# Patient Record
Sex: Male | Born: 1970 | Race: White | Hispanic: No | Marital: Married | Smoking: Never smoker
Health system: Southern US, Community
[De-identification: ages and names within clinical notes are randomized; demographics above are authoritative.]

## PROBLEM LIST (undated history)

## (undated) DIAGNOSIS — F32A Depression, unspecified: Secondary | ICD-10-CM

## (undated) DIAGNOSIS — I219 Acute myocardial infarction, unspecified: Secondary | ICD-10-CM

## (undated) DIAGNOSIS — F319 Bipolar disorder, unspecified: Secondary | ICD-10-CM

## (undated) DIAGNOSIS — I10 Essential (primary) hypertension: Secondary | ICD-10-CM

## (undated) DIAGNOSIS — E7800 Pure hypercholesterolemia, unspecified: Secondary | ICD-10-CM

## (undated) DIAGNOSIS — T300 Burn of unspecified body region, unspecified degree: Secondary | ICD-10-CM

## (undated) DIAGNOSIS — M1990 Unspecified osteoarthritis, unspecified site: Secondary | ICD-10-CM

## (undated) DIAGNOSIS — F329 Major depressive disorder, single episode, unspecified: Secondary | ICD-10-CM

## (undated) DIAGNOSIS — E119 Type 2 diabetes mellitus without complications: Secondary | ICD-10-CM

## (undated) DIAGNOSIS — R911 Solitary pulmonary nodule: Secondary | ICD-10-CM

## (undated) DIAGNOSIS — K5900 Constipation, unspecified: Secondary | ICD-10-CM

## (undated) DIAGNOSIS — F40240 Claustrophobia: Secondary | ICD-10-CM

## (undated) DIAGNOSIS — Z8489 Family history of other specified conditions: Secondary | ICD-10-CM

## (undated) DIAGNOSIS — F419 Anxiety disorder, unspecified: Secondary | ICD-10-CM

## (undated) DIAGNOSIS — G4730 Sleep apnea, unspecified: Secondary | ICD-10-CM

## (undated) DIAGNOSIS — I2510 Atherosclerotic heart disease of native coronary artery without angina pectoris: Secondary | ICD-10-CM

## (undated) DIAGNOSIS — T8859XA Other complications of anesthesia, initial encounter: Secondary | ICD-10-CM

## (undated) DIAGNOSIS — T7840XA Allergy, unspecified, initial encounter: Secondary | ICD-10-CM

## (undated) DIAGNOSIS — T4145XA Adverse effect of unspecified anesthetic, initial encounter: Secondary | ICD-10-CM

## (undated) DIAGNOSIS — T754XXA Electrocution, initial encounter: Secondary | ICD-10-CM

## (undated) DIAGNOSIS — G459 Transient cerebral ischemic attack, unspecified: Secondary | ICD-10-CM

## (undated) DIAGNOSIS — K219 Gastro-esophageal reflux disease without esophagitis: Secondary | ICD-10-CM

## (undated) DIAGNOSIS — G43909 Migraine, unspecified, not intractable, without status migrainosus: Secondary | ICD-10-CM

## (undated) DIAGNOSIS — I209 Angina pectoris, unspecified: Secondary | ICD-10-CM

## (undated) DIAGNOSIS — I639 Cerebral infarction, unspecified: Secondary | ICD-10-CM

## (undated) HISTORY — PX: NASAL SINUS SURGERY: SHX719

## (undated) HISTORY — DX: Type 2 diabetes mellitus without complications: E11.9

## (undated) HISTORY — PX: JOINT REPLACEMENT: SHX530

## (undated) HISTORY — DX: Acute myocardial infarction, unspecified: I21.9

## (undated) HISTORY — PX: LAPAROSCOPIC CHOLECYSTECTOMY: SUR755

## (undated) HISTORY — DX: Essential (primary) hypertension: I10

## (undated) HISTORY — DX: Allergy, unspecified, initial encounter: T78.40XA

## (undated) HISTORY — DX: Constipation, unspecified: K59.00

## (undated) HISTORY — PX: OTHER SURGICAL HISTORY: SHX169

## (undated) HISTORY — PX: CARDIAC CATHETERIZATION: SHX172

## (undated) HISTORY — PX: NOSE SURGERY: SHX723

---
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
## 2002-12-23 DIAGNOSIS — I639 Cerebral infarction, unspecified: Secondary | ICD-10-CM | POA: Insufficient documentation

## 2002-12-23 HISTORY — DX: Cerebral infarction, unspecified: I63.9

## 2003-12-24 DIAGNOSIS — I2510 Atherosclerotic heart disease of native coronary artery without angina pectoris: Secondary | ICD-10-CM | POA: Insufficient documentation

## 2003-12-24 HISTORY — DX: Atherosclerotic heart disease of native coronary artery without angina pectoris: I25.10

## 2007-11-25 ENCOUNTER — Ambulatory Visit (HOSPITAL_COMMUNITY): Admission: RE | Admit: 2007-11-25 | Discharge: 2007-11-25 | Payer: Self-pay | Admitting: Orthopedic Surgery

## 2009-12-23 HISTORY — PX: CORONARY ANGIOPLASTY WITH STENT PLACEMENT: SHX49

## 2010-12-23 DIAGNOSIS — I219 Acute myocardial infarction, unspecified: Secondary | ICD-10-CM

## 2010-12-23 HISTORY — DX: Acute myocardial infarction, unspecified: I21.9

## 2010-12-23 HISTORY — PX: SHOULDER OPEN ROTATOR CUFF REPAIR: SHX2407

## 2014-06-02 ENCOUNTER — Encounter: Payer: Self-pay | Admitting: Internal Medicine

## 2014-06-02 ENCOUNTER — Ambulatory Visit (INDEPENDENT_AMBULATORY_CARE_PROVIDER_SITE_OTHER): Payer: BC Managed Care – PPO | Admitting: Internal Medicine

## 2014-06-02 VITALS — BP 112/70 | HR 66 | Ht 68.0 in | Wt 244.4 lb

## 2014-06-02 DIAGNOSIS — R0600 Dyspnea, unspecified: Secondary | ICD-10-CM

## 2014-06-02 DIAGNOSIS — I2510 Atherosclerotic heart disease of native coronary artery without angina pectoris: Secondary | ICD-10-CM

## 2014-06-02 DIAGNOSIS — R0683 Snoring: Secondary | ICD-10-CM

## 2014-06-02 DIAGNOSIS — R079 Chest pain, unspecified: Secondary | ICD-10-CM

## 2014-06-02 DIAGNOSIS — I252 Old myocardial infarction: Secondary | ICD-10-CM

## 2014-06-02 DIAGNOSIS — I10 Essential (primary) hypertension: Secondary | ICD-10-CM

## 2014-06-02 DIAGNOSIS — R0602 Shortness of breath: Secondary | ICD-10-CM

## 2014-06-02 DIAGNOSIS — R0609 Other forms of dyspnea: Secondary | ICD-10-CM | POA: Insufficient documentation

## 2014-06-02 DIAGNOSIS — R5383 Other fatigue: Secondary | ICD-10-CM

## 2014-06-02 DIAGNOSIS — R0989 Other specified symptoms and signs involving the circulatory and respiratory systems: Secondary | ICD-10-CM

## 2014-06-02 DIAGNOSIS — R5381 Other malaise: Secondary | ICD-10-CM

## 2014-06-02 HISTORY — DX: Essential (primary) hypertension: I10

## 2014-06-02 HISTORY — DX: Other fatigue: R53.83

## 2014-06-02 HISTORY — DX: Other forms of dyspnea: R06.09

## 2014-06-02 HISTORY — DX: Dyspnea, unspecified: R06.00

## 2014-06-02 HISTORY — DX: Snoring: R06.83

## 2014-06-02 HISTORY — DX: Old myocardial infarction: I25.2

## 2014-06-02 HISTORY — DX: Atherosclerotic heart disease of native coronary artery without angina pectoris: I25.10

## 2014-06-02 NOTE — Patient Instructions (Signed)
Your physician recommends that you schedule a follow-up appointment in: After Test  Your physician has recommended that you have a cardiopulmonary stress test (CPX). CPX testing is a non-invasive measurement of heart and lung function. It replaces a traditional treadmill stress test. This type of test provides a tremendous amount of information that relates not only to your present condition but also for future outcomes. This test combines measurements of you ventilation, respiratory gas exchange in the lungs, electrocardiogram (EKG), blood pressure and physical response before, during, and following an exercise protocol.

## 2014-06-02 NOTE — Progress Notes (Signed)
OFFICE NOTE  Chief Complaint:  Establish new cardiologist  Primary Care Physician: Riley Riches, NP  HPI:  Riley Grant is a 43 year old male (who self-reports that he has a fifth grade education and cannot read or write), who is here to establish cardiovascular care. He was previously seen by cardiologist and in 2012 underwent heart catheterization after suffering 2 transient ischemic attacks. No etiology was found. He then apparently had stress testing which was not remarkable, but persisted in having chest pain and had a heart catheterization which shows a 70% proximal LAD stenosis. I reviewed a print out that was provided from the heart catheterization as well as a post PCI photo demonstrating a stent in the proximal LAD. It appears he had a very good result. He said he felt markedly better for about 5 days after the procedure and then started going downhill after that. He reports progressive shortness of breath, fatigue, poor exercise tolerance, feeling of bloating and early satiety, but normal bowel movements. He has undergone several tests as well as blood work which have only revealed a low testosterone. It is not clear whether this is low total testosterone or whether his free testosterone was checked, but it would be worthwhile doing this. His family history is significant for father who had an MI in her grandfather who had died of sudden cardiac death at age 33. He also reports leg pain, cold feet, lightheadedness and headaches.  He reports poor sleep at night and his wife notes that he snores and occasionally stops breathing which is concerning for a short sleep apnea.  PMHx:  Past Medical History  Diagnosis Date  . History of TIAs   . Heart attack     History reviewed. No pertinent past surgical history.  FAMHx:  Family History  Problem Relation Age of Onset  . Hypertension Father   . Stroke Father   . Hypertension Maternal Grandmother   . Cancer Maternal Grandmother     . Hypertension Paternal Grandmother   . Alzheimer's disease Paternal Grandmother     SOCHx:   reports that he has never smoked. He has never used smokeless tobacco. He reports that he does not drink alcohol or use illicit drugs.  ALLERGIES:  No Known Allergies  ROS: A comprehensive review of systems was negative except for: Constitutional: positive for fatigue Respiratory: positive for dyspnea on exertion Cardiovascular: positive for chest pain Behavioral/Psych: positive for anxiety  HOME MEDS: Current Outpatient Prescriptions  Medication Sig Dispense Refill  . aspirin EC 81 MG tablet Take 81 mg by mouth daily.      . clopidogrel (PLAVIX) 75 MG tablet Take 1 tablet by mouth daily.      . fluorometholone (FML) 0.1 % ophthalmic suspension Place 1 drop into both eyes 3 (three) times daily.      . isosorbide mononitrate (IMDUR) 30 MG 24 hr tablet Take 1 tablet by mouth daily.      Marland Kitchen lisinopril (PRINIVIL,ZESTRIL) 20 MG tablet Take 1 tablet by mouth daily.      Marland Kitchen loratadine (CLARITIN) 10 MG tablet Take 10 mg by mouth daily.      . metoprolol succinate (TOPROL-XL) 25 MG 24 hr tablet Take 1 tablet by mouth daily.      . nitroGLYCERIN (NITROSTAT) 0.3 MG SL tablet Place 0.3 mg under the tongue every 5 (five) minutes as needed for chest pain.      Marland Kitchen OVER THE COUNTER MEDICATION Renafood - take 1 tablet by mouth once  daily.      Marland Kitchen OVER THE COUNTER MEDICATION Diet & Cleanse - take 1 tablet by mouth once daily.      . ranitidine (ZANTAC) 150 MG tablet Take 150 mg by mouth daily.       No current facility-administered medications for this visit.    LABS/IMAGING: No results found for this or any previous visit (from the past 48 hour(s)). No results found.  VITALS: BP 112/70  Pulse 66  Ht 5\' 8"  (1.727 m)  Wt 244 lb 6.4 oz (110.859 kg)  BMI 37.17 kg/m2  EXAM: General appearance: alert and no distress Neck: no carotid bruit and no JVD Lungs: clear to auscultation bilaterally Heart:  regular rate and rhythm, S1, S2 normal, no murmur, click, rub or gallop Abdomen: soft, non-tender; bowel sounds normal; no masses,  no organomegaly Extremities: extremities normal, atraumatic, no cyanosis or edema Pulses: 2+ and symmetric Skin: Skin color, texture, turgor normal. No rashes or lesions Neurologic: Alert and oriented X 3, normal strength and tone. Normal symmetric reflexes. Normal coordination and gait Psych: Mildly anxious  EKG: Normal sinus rhythm at 66, no ischemic changes  ASSESSMENT: 1. Fatigue, chest pain, shortness of breath 2. Coronary artery disease status post PCI to the proximal LAD 3. Hypertension 4. Snoring and nonrestorative sleep  PLAN: 1.   Riley Grant has a number of complaints today including fatigue, chest pain, shortness of breath and markedly decreased exercise tolerance. He reports he can barely do any activities without getting shortness of breath. His symptoms of it progressively getting worse since about 5 days after his stent in 2012. Despite this, he has not had any other cardiac events that we are aware of. Most of his symptoms occur with exertion and are associated with shortness of breath. His chest pain is not as significant as it was when he had a stent. I would recommend an exercise Cardiolite metabolic stress test to evaluate both for pulmonary and cardiopulmonary etiologies of his shortness of breath and decreased exercise tolerance. If this is markedly abnormal he may need cardiac catheterization. We may want to also consider echocardiography to evaluate for any cardiomyopathy, although his physical exam does not support any heart failure. His fatigue, poor sleep and lack of energy may be due to sleep apnea. I would recommend a sleep study and will arrange for that. I'm happy to refill his cardiac medications today and he should continue on those. He questioned about whether it was safe to take supplemental testosterone with coronary disease. Data on  this suggest that it is probably okay without any significant increased risk as long as the target of treatment is to the normal range of testosterone. He should have a free testosterone performed in addition to total testosterone, preferably between 7 and 8 AM in a fasting state. This may give the most accurate result.  It is not clear that the low testosterone alone is causing him such significant symptoms. He did have some residual coronary disease distal in the LAD which could have certainly become worse although would not explain why his symptoms have been persistent for over 3 years.    Plan to see him back to discuss results of his cardiopulmonary exercise test as well as his sleep study. I will try to obtain additional records from his cardiologist in [place].  Thank you very much for the consultation.  Pixie Casino, MD, [facility] Attending Cardiologist [facility]  Lakara Weiland C 06/02/2014, 5:30 PM

## 2014-06-07 ENCOUNTER — Ambulatory Visit (HOSPITAL_COMMUNITY): Payer: BC Managed Care – PPO | Attending: Internal Medicine

## 2014-06-07 DIAGNOSIS — R0602 Shortness of breath: Secondary | ICD-10-CM

## 2014-06-07 DIAGNOSIS — R079 Chest pain, unspecified: Secondary | ICD-10-CM

## 2014-07-14 ENCOUNTER — Other Ambulatory Visit: Payer: Self-pay | Admitting: *Deleted

## 2014-07-14 DIAGNOSIS — R5383 Other fatigue: Secondary | ICD-10-CM

## 2014-07-14 DIAGNOSIS — I2510 Atherosclerotic heart disease of native coronary artery without angina pectoris: Secondary | ICD-10-CM

## 2014-07-14 DIAGNOSIS — R0683 Snoring: Secondary | ICD-10-CM

## 2014-08-21 ENCOUNTER — Ambulatory Visit (HOSPITAL_BASED_OUTPATIENT_CLINIC_OR_DEPARTMENT_OTHER): Payer: BC Managed Care – PPO | Attending: Internal Medicine | Admitting: Radiology

## 2014-08-21 VITALS — Ht 70.0 in | Wt 225.0 lb

## 2014-08-21 DIAGNOSIS — I2510 Atherosclerotic heart disease of native coronary artery without angina pectoris: Secondary | ICD-10-CM

## 2014-08-21 DIAGNOSIS — R5383 Other fatigue: Secondary | ICD-10-CM

## 2014-08-21 DIAGNOSIS — G4733 Obstructive sleep apnea (adult) (pediatric): Secondary | ICD-10-CM | POA: Insufficient documentation

## 2014-08-21 DIAGNOSIS — R0683 Snoring: Secondary | ICD-10-CM

## 2014-08-26 DIAGNOSIS — G4710 Hypersomnia, unspecified: Secondary | ICD-10-CM

## 2014-08-26 DIAGNOSIS — G4730 Sleep apnea, unspecified: Secondary | ICD-10-CM

## 2014-08-26 NOTE — Sleep Study (Signed)
   NAME: Riley Grant DATE OF BIRTH:  1971-07-28 MEDICAL RECORD NUMBER [id number]  LOCATION: [facility]  PHYSICIAN: Kathee Delton  DATE OF STUDY: 08/21/2014  SLEEP STUDY TYPE: Nocturnal Polysomnogram               REFERRING PHYSICIAN: Pixie Casino., MD  INDICATION FOR STUDY: Hypersomnia with sleep apnea  EPWORTH SLEEPINESS SCORE:  24 HEIGHT: 5\' 10"  (177.8 cm)  WEIGHT: 225 lb (102.059 kg)    Body mass index is 32.28 kg/(m^2).  NECK SIZE: 17 in.  MEDICATIONS: Reviewed in the sleep record  SLEEP ARCHITECTURE: The patient had a total sleep time of 308 minutes with very little slow-wave sleep or REM. Sleep onset latency was prolonged at 61 minutes, and REM onset was delayed as well. Sleep efficiency was poor at 56% during the diagnostic portion of the study, but increased to 81% during the titration portion.  RESPIRATORY DATA: The patient underwent a split night protocol where he was found to have 41 obstructive events in the first 126 minutes of sleep. This gave him an AHI of 20 events per hour during the diagnostic portion of the study. The events occurred in all body positions, and there was loud snoring noted throughout. By protocol, he was fitted with a medium Fischer Paykel Simplus full face mask, and found to have an optimal CPAP pressure of 9 cm of water.  OXYGEN DATA: There was oxygen desaturation as low as 80% with the patient's obstructive events  CARDIAC DATA: Rare PAC noted  MOVEMENT/PARASOMNIA: The patient had no significant periodic limb movements or other abnormal behavior.  IMPRESSION/ RECOMMENDATION:    1) split-night study reveals moderate obstructive sleep apnea, with an AHI of 20 events per hour and oxygen desaturation as low as 80% during the diagnostic portion of the study. The patient was then fitted with a medium Fischer Paykel Simplus full face mask, and found to have an optimal CPAP pressure of 9 cm of water. He should also be  encouraged to work aggressively on weight loss.  2) rare PAC noted, but no clinically significant arrhythmias were seen.     Kathee Delton Diplomate, American Board of Sleep Medicine  ELECTRONICALLY SIGNED ON:  08/26/2014, 6:07 PM [place] PH: [phone]   FX: [phone] [place]

## 2014-09-01 ENCOUNTER — Telehealth: Payer: Self-pay | Admitting: Internal Medicine

## 2014-09-01 NOTE — Telephone Encounter (Signed)
Pt had a sleep study on 08-20-14,still have not heard from it.

## 2014-09-01 NOTE — Telephone Encounter (Signed)
Deferred to Dr. Debara Pickett. See sleep study report read by Dr. Gwenette Greet.

## 2014-09-06 ENCOUNTER — Telehealth: Payer: Self-pay | Admitting: *Deleted

## 2014-09-06 NOTE — Telephone Encounter (Signed)
Called patient to notify him of his sleep study results. He requested that I call and notify his wife of the results.

## 2014-09-06 NOTE — Telephone Encounter (Signed)
Moderate OSA - CPAP At 9 cmH20 - he needs referral to DME supply company.   Dr. Lemmie Evens

## 2014-09-06 NOTE — Telephone Encounter (Signed)
Spoke with wife and informed her of patient's sleep results and follow plan.

## 2014-09-06 NOTE — Telephone Encounter (Signed)
Patient and wife notified of sleep study results. Will do referral to [facility] for CPAP equipment and set up.

## 2014-09-07 ENCOUNTER — Telehealth: Payer: Self-pay | Admitting: *Deleted

## 2014-09-07 NOTE — Telephone Encounter (Signed)
Faxed referral  Order,sleep study, supporting office notes, demographics and insurance information to [facility].

## 2014-09-19 ENCOUNTER — Telehealth: Payer: Self-pay | Admitting: Internal Medicine

## 2014-09-19 NOTE — Telephone Encounter (Signed)
Pt still have not received his sleep machine,she need you to check on this please.

## 2014-09-19 NOTE — Telephone Encounter (Signed)
Called [facility] and they are going to call pt. And let them know whats going on

## 2014-09-21 ENCOUNTER — Telehealth: Payer: Self-pay | Admitting: Internal Medicine

## 2014-09-21 NOTE — Telephone Encounter (Signed)
RN called and spoke to Riley Grant (of  [facility]) she states she received authorization from [employer]. She will be contacting the patient today. RN notified wife. She is aware.

## 2014-09-21 NOTE — Telephone Encounter (Signed)
Pt still have not gotten his sleep machine and he have not heard from [facility].

## 2014-11-10 ENCOUNTER — Ambulatory Visit: Payer: BC Managed Care – PPO | Admitting: Cardiovascular Disease

## 2015-01-04 ENCOUNTER — Telehealth: Payer: Self-pay | Admitting: Internal Medicine

## 2015-01-05 ENCOUNTER — Ambulatory Visit: Payer: Self-pay | Admitting: Internal Medicine

## 2015-01-05 NOTE — Telephone Encounter (Signed)
Close encounter 

## 2015-01-17 ENCOUNTER — Ambulatory Visit: Payer: Self-pay | Admitting: Internal Medicine

## 2015-01-24 ENCOUNTER — Encounter: Payer: Self-pay | Admitting: Internal Medicine

## 2015-02-15 ENCOUNTER — Telehealth: Payer: Self-pay | Admitting: Internal Medicine

## 2015-02-15 NOTE — Telephone Encounter (Signed)
Returned call to wife. She states she wants to schedule an appointment for her husband as he was told by his PCP after some lab work that his labs showed he was at an increased risk of heart attack and stroke and that his body was "absorbing" all the medication and his plavix wasn't getting to his body appropriately. Informed wife of Dr. Lysbeth Penner first available appointment 3/11 and that he can canceled and was a no show for 2 visits in January. Appointment made for 3/11 at 2:45 and informed wife that I will attempt to obtain a copy of these labs for Dr. Debara Pickett to review prior to his appointment. She agreed with plan and voiced understanding.

## 2015-02-15 NOTE — Telephone Encounter (Signed)
Labs requested from PCP. 

## 2015-02-15 NOTE — Telephone Encounter (Signed)
Pt's wife called in stating that his PCP recommended that he follow up with his cardiologist as soon as possible because he is at high risk for a stroke and heart attack. I informed her that Dr. Lysbeth Penner next available appt ws 3/11 and an extender 3/10. Please call  Thanks

## 2015-02-15 NOTE — Telephone Encounter (Signed)
Labs received and placed in Dr. Lysbeth Penner mailbox for review when he is back in office

## 2015-03-03 ENCOUNTER — Ambulatory Visit (INDEPENDENT_AMBULATORY_CARE_PROVIDER_SITE_OTHER): Payer: 59 | Admitting: Internal Medicine

## 2015-03-03 ENCOUNTER — Encounter: Payer: Self-pay | Admitting: Internal Medicine

## 2015-03-03 ENCOUNTER — Ambulatory Visit
Admission: RE | Admit: 2015-03-03 | Discharge: 2015-03-03 | Disposition: A | Discharge status: Home / Self Care | Payer: 59 | Source: Ambulatory Visit | Attending: Internal Medicine | Admitting: Internal Medicine

## 2015-03-03 ENCOUNTER — Other Ambulatory Visit: Payer: Self-pay | Admitting: *Deleted

## 2015-03-03 VITALS — BP 108/62 | HR 82 | Ht 68.0 in | Wt 256.1 lb

## 2015-03-03 DIAGNOSIS — I2510 Atherosclerotic heart disease of native coronary artery without angina pectoris: Secondary | ICD-10-CM

## 2015-03-03 DIAGNOSIS — I208 Other forms of angina pectoris: Secondary | ICD-10-CM

## 2015-03-03 DIAGNOSIS — R079 Chest pain, unspecified: Secondary | ICD-10-CM

## 2015-03-03 DIAGNOSIS — Z01818 Encounter for other preprocedural examination: Secondary | ICD-10-CM

## 2015-03-03 DIAGNOSIS — D689 Coagulation defect, unspecified: Secondary | ICD-10-CM

## 2015-03-03 DIAGNOSIS — I10 Essential (primary) hypertension: Secondary | ICD-10-CM

## 2015-03-03 DIAGNOSIS — I209 Angina pectoris, unspecified: Secondary | ICD-10-CM

## 2015-03-03 DIAGNOSIS — R5383 Other fatigue: Secondary | ICD-10-CM

## 2015-03-03 DIAGNOSIS — I252 Old myocardial infarction: Secondary | ICD-10-CM

## 2015-03-03 DIAGNOSIS — I25119 Atherosclerotic heart disease of native coronary artery with unspecified angina pectoris: Secondary | ICD-10-CM

## 2015-03-03 NOTE — Patient Instructions (Signed)
Your physician has requested that you have a cardiac catheterization (Right Radial; Dr. Debara Pickett). Cardiac catheterization is used to diagnose and/or treat various heart conditions. Doctors may recommend this procedure for a number of different reasons. The most common reason is to evaluate chest pain. Chest pain can be a symptom of coronary artery disease (CAD), and cardiac catheterization can show whether plaque is narrowing or blocking your heart's arteries. This procedure is also used to evaluate the valves, as well as measure the blood flow and oxygen levels in different parts of your heart. For further information please visit [email or web address].   Following your catheterization, you will not be allowed to drive for 3 days.  No lifting, pushing, or pulling greater that 10 pounds is allowed for 1 week.  You will be required to have the following tests prior to the procedure:  1. Blood work-the blood work can be done no more than 7 days prior to the procedure.  It can be done at any [facility] lab.  There is one downstairs on the first floor of this building and one in the [facility] ([address])  2. Chest Xray-the chest xray order has already been placed at the [facility].     Please have patient follow up with Dr. Debara Pickett after the procedure.

## 2015-03-03 NOTE — Progress Notes (Signed)
OFFICE NOTE  Chief Complaint:  Severe, persistent fatigue and accelerating chest pain  Primary Care Physician: Imagene Riches, NP  HPI:  Riley Grant is a 44 year old male (who self-reports that he has a fifth grade education and cannot read or write), who is here to establish cardiovascular care. He was previously seen by cardiologist and in 2012 underwent heart catheterization after suffering 2 transient ischemic attacks. No etiology was found. He then apparently had stress testing which was not remarkable, but persisted in having chest pain and had a heart catheterization which shows a 70% proximal LAD stenosis. I reviewed a print out that was provided from the heart catheterization as well as a post PCI photo demonstrating a stent in the proximal LAD. It appears he had a very good result. He said he felt markedly better for about 5 days after the procedure and then started going downhill after that. He reports progressive shortness of breath, fatigue, poor exercise tolerance, feeling of bloating and early satiety, but normal bowel movements. He has undergone several tests as well as blood work which have only revealed a low testosterone. It is not clear whether this is low total testosterone or whether his free testosterone was checked, but it would be worthwhile doing this. His family history is significant for father who had an MI in her grandfather who had died of sudden cardiac death at age 44. He also reports leg pain, cold feet, lightheadedness and headaches.  He reports poor sleep at night and his wife notes that he snores and occasionally stops breathing which is concerning for a short sleep apnea.  I the pleasure see Riley Grant back in the office today. He tells me that he's had several episodes of significant chest pain including one associated with diaphoresis for which he felt faint and almost passed out, however the symptoms quickly subsided. He's also had marked fatigue, which was  addressed at his last office visit. He says that after he had a stent in 2012, he felt like a new man for about 6 months that is felt progressively more fatigued with almost no energy for the past 4 years. I referred him for an exercise cardio metabolic stress test last year which he underwent and did extremely well. There is no evidence of any ischemia, however he said he felt exhausted for several days afterwards. He now feels like that he wears out with very little activity. Today he brought a battery of tests performed by his primary care provider including a genetic tests and it expanded lipid profile. I reviewed these tests in detail and spent about 45 minutes in total. The highlights include an abnormal lipid profile, which is known. Riley Grant unfortunately was intolerant to both Lipitor and Crestor due to myalgias. This may also be related to vitamin D deficiency. He was noted to have a vitamin D level of 18. He also has a mildly elevated homocystine level and elevated APoB and LP-PLA2. He is a rapid metabolizer of Plavix therefore normal dosing is appropriate. EKG today shows normal sinus rhythm without ischemic changes. He was recently diagnosed with obstructive sleep apnea that was moderate, and was fitted with CPAP. He says that that actually made him feel worse and he was claustrophobic with it and has refused to wear it.  PMHx:  Past Medical History  Diagnosis Date  . History of TIAs   . Heart attack     No past surgical history on file.  FAMHx:  Family  History  Problem Relation Age of Onset  . Hypertension Father   . Stroke Father   . Hypertension Maternal Grandmother   . Cancer Maternal Grandmother   . Hypertension Paternal Grandmother   . Alzheimer's disease Paternal Grandmother     SOCHx:   reports that he has never smoked. He has never used smokeless tobacco. He reports that he does not drink alcohol or use illicit drugs.  ALLERGIES:  No Known Allergies  ROS: A  comprehensive review of systems was negative except for: Constitutional: positive for fatigue Respiratory: positive for dyspnea on exertion Cardiovascular: positive for chest pain Behavioral/Psych: positive for anxiety  HOME MEDS: Current Outpatient Prescriptions  Medication Sig Dispense Refill  . aspirin EC 81 MG tablet Take 81 mg by mouth daily.    . clopidogrel (PLAVIX) 75 MG tablet Take 1 tablet by mouth daily.    . fluorometholone (FML) 0.1 % ophthalmic suspension Place 1 drop into both eyes 3 (three) times daily.    . isosorbide mononitrate (IMDUR) 30 MG 24 hr tablet Take 1 tablet by mouth daily.    Marland Kitchen lisinopril (PRINIVIL,ZESTRIL) 20 MG tablet Take 1 tablet by mouth daily.    Marland Kitchen loratadine (CLARITIN) 10 MG tablet Take 10 mg by mouth daily.    . metoprolol succinate (TOPROL-XL) 25 MG 24 hr tablet Take 1 tablet by mouth daily.    . nitroGLYCERIN (NITROSTAT) 0.3 MG SL tablet Place 0.3 mg under the tongue every 5 (five) minutes as needed for chest pain.    Marland Kitchen OVER THE COUNTER MEDICATION Renafood - take 1 tablet by mouth once daily.    Marland Kitchen OVER THE COUNTER MEDICATION Diet & Cleanse - take 1 tablet by mouth once daily.    . ranitidine (ZANTAC) 150 MG tablet Take 150 mg by mouth daily.     No current facility-administered medications for this visit.    LABS/IMAGING: No results found for this or any previous visit (from the past 48 hour(s)). Dg Chest 2 View  03/03/2015   CLINICAL DATA:  Pre cardiac catheterization.  Shortness of breath.  EXAM: CHEST  2 VIEW  COMPARISON:  04/04/2014, report only.  FINDINGS: Midline trachea.  Normal heart size and mediastinal contours.  Sharp costophrenic angles.  No pneumothorax.  Clear lungs.  Mild thoracic spondylosis.  IMPRESSION: No active cardiopulmonary disease.   Electronically Signed   By: Abigail Miyamoto M.D.   On: 03/03/2015 17:31    VITALS: BP 108/62 mmHg  Pulse 82  Ht 5\' 8"  (1.727 m)  Wt 256 lb 1.6 oz (116.166 kg)  BMI 38.95  kg/m2  EXAM: General appearance: alert and no distress Neck: no carotid bruit and no JVD Lungs: clear to auscultation bilaterally Heart: regular rate and rhythm, S1, S2 normal, no murmur, click, rub or gallop Abdomen: soft, non-tender; bowel sounds normal; no masses,  no organomegaly Extremities: extremities normal, atraumatic, no cyanosis or edema Pulses: 2+ and symmetric Skin: Skin color, texture, turgor normal. No rashes or lesions Neurologic: Alert and oriented X 3, normal strength and tone. Normal symmetric reflexes. Normal coordination and gait Psych: Mildly anxious  EKG: Normal sinus rhythm at 82, no ischemic changes  ASSESSMENT: 1. Fatigue, chest pain, shortness of breath 2. Coronary artery disease status post PCI to the proximal LAD 3. Hypertension 4. OSA-intolerant of CPAP 5. Progressive class III angina 6. Dyslipidemia-intolerant to Lipitor and Crestor  PLAN: 1.   Riley Grant has been describing progressive chest pain and is had long-standing, severe fatigue and exercise  intolerance. Despite this, he performed exceedingly well on an exercise bicycle test last year. There seems to be a disconnect between his symptoms and his exercise performance. I wonder if this is a spectrum of chronic fatigue syndrome. Certainly sleep apnea could be playing a role. Unfortunately he's been intolerant for CPAP. He may also feel bad because of his low vitamin D levels. I'm concerned about several episodes of chest pain these had. While I do think it unlikely this represents worsening coronary disease that is a possibility. We talked about stress testing although he had a false negative stress test only a week before he ultimately had cardiac catheterization which demonstrated a tight LAD lesion. Based on the fact that he has known coronary artery disease and a prior LAD stent and the fact that stress testing was not helpful in the past, I recommended a repeat left heart catheterization. We  discussed the risks and benefits of this procedure and he provided informed consent to proceed.  I'll be in contact with the results of his catheterization. If there is no significant coronary disease, I will make further adjustments in his medications. I would recommend treating his vitamin D level, initially with 50,000 units over 3-4 month period and then decreasing it to a maintenance dose of 2000 units daily. In addition we could consider starting him on Zetia as an alternative to lower his cholesterol or trying another statin to see if he would tolerate it, once his vitamin D levels are optimized. He is also known to have low testosterone and if there is no significant coronary disease by catheterization, it may be worthwhile considering testosterone replacement therapy. I would also encourage him to be refitted for possible treatments of sleep apnea, which could be contributing to his symptoms.  Pixie Casino, MD, [facility] Attending Cardiologist [facility]  Lestat Golob C 03/03/2015, 5:57 PM

## 2015-03-03 NOTE — Progress Notes (Signed)
Hospital orders placed for upcoming Heart Catherization

## 2015-03-04 LAB — APTT: APTT: 31 s (ref 24–37)

## 2015-03-04 LAB — BASIC METABOLIC PANEL
BUN: 15 mg/dL (ref 6–23)
CHLORIDE: 101 meq/L (ref 96–112)
CO2: 22 meq/L (ref 19–32)
CREATININE: 0.98 mg/dL (ref 0.50–1.35)
Calcium: 9.3 mg/dL (ref 8.4–10.5)
Glucose, Bld: 77 mg/dL (ref 70–99)
Potassium: 4.2 mEq/L (ref 3.5–5.3)
Sodium: 137 mEq/L (ref 135–145)

## 2015-03-04 LAB — CBC
HEMATOCRIT: 43.8 % (ref 39.0–52.0)
Hemoglobin: 14.9 g/dL (ref 13.0–17.0)
MCH: 29.5 pg (ref 26.0–34.0)
MCHC: 34 g/dL (ref 30.0–36.0)
MCV: 86.7 fL (ref 78.0–100.0)
MPV: 8.9 fL (ref 8.6–12.4)
Platelets: 279 10*3/uL (ref 150–400)
RBC: 5.05 MIL/uL (ref 4.22–5.81)
RDW: 14.6 % (ref 11.5–15.5)
WBC: 6.7 10*3/uL (ref 4.0–10.5)

## 2015-03-04 LAB — PROTIME-INR
INR: 1.01 (ref <1.50)
Prothrombin Time: 13.3 seconds (ref 11.6–15.2)

## 2015-03-06 ENCOUNTER — Encounter: Payer: Self-pay | Admitting: Internal Medicine

## 2015-03-06 ENCOUNTER — Encounter (HOSPITAL_COMMUNITY): Payer: Self-pay | Admitting: Pharmacy Technician

## 2015-03-07 ENCOUNTER — Encounter (HOSPITAL_COMMUNITY): Payer: Self-pay | Admitting: *Deleted

## 2015-03-07 ENCOUNTER — Ambulatory Visit (HOSPITAL_COMMUNITY)
Admission: RE | Admit: 2015-03-07 | Discharge: 2015-03-07 | Disposition: A | Discharge status: Home / Self Care | Payer: 59 | Source: Ambulatory Visit | Attending: Internal Medicine | Admitting: Internal Medicine

## 2015-03-07 ENCOUNTER — Encounter (HOSPITAL_COMMUNITY)
Admission: RE | Disposition: A | Discharge status: Home / Self Care | Payer: Self-pay | Source: Ambulatory Visit | Attending: Internal Medicine

## 2015-03-07 DIAGNOSIS — Z8249 Family history of ischemic heart disease and other diseases of the circulatory system: Secondary | ICD-10-CM | POA: Insufficient documentation

## 2015-03-07 DIAGNOSIS — I2510 Atherosclerotic heart disease of native coronary artery without angina pectoris: Secondary | ICD-10-CM | POA: Diagnosis not present

## 2015-03-07 DIAGNOSIS — I209 Angina pectoris, unspecified: Secondary | ICD-10-CM | POA: Diagnosis present

## 2015-03-07 DIAGNOSIS — G4733 Obstructive sleep apnea (adult) (pediatric): Secondary | ICD-10-CM | POA: Diagnosis not present

## 2015-03-07 DIAGNOSIS — Z955 Presence of coronary angioplasty implant and graft: Secondary | ICD-10-CM | POA: Diagnosis not present

## 2015-03-07 DIAGNOSIS — I25119 Atherosclerotic heart disease of native coronary artery with unspecified angina pectoris: Secondary | ICD-10-CM | POA: Insufficient documentation

## 2015-03-07 DIAGNOSIS — I252 Old myocardial infarction: Secondary | ICD-10-CM

## 2015-03-07 DIAGNOSIS — Z8673 Personal history of transient ischemic attack (TIA), and cerebral infarction without residual deficits: Secondary | ICD-10-CM | POA: Insufficient documentation

## 2015-03-07 DIAGNOSIS — R0609 Other forms of dyspnea: Secondary | ICD-10-CM | POA: Diagnosis present

## 2015-03-07 DIAGNOSIS — I208 Other forms of angina pectoris: Secondary | ICD-10-CM

## 2015-03-07 HISTORY — PX: LEFT HEART CATHETERIZATION WITH CORONARY ANGIOGRAM: SHX5451

## 2015-03-07 SURGERY — LEFT HEART CATHETERIZATION WITH CORONARY ANGIOGRAM
Anesthesia: LOCAL

## 2015-03-07 MED ORDER — MIDAZOLAM HCL 2 MG/2ML IJ SOLN
INTRAMUSCULAR | Status: AC
  Filled 2015-03-07: qty 2

## 2015-03-07 MED ORDER — NITROGLYCERIN 1 MG/10 ML FOR IR/CATH LAB
INTRA_ARTERIAL | Status: AC
  Filled 2015-03-07: qty 10

## 2015-03-07 MED ORDER — FENTANYL CITRATE 0.05 MG/ML IJ SOLN
INTRAMUSCULAR | Status: AC
  Filled 2015-03-07: qty 2

## 2015-03-07 MED ORDER — SODIUM CHLORIDE 0.9 % IV SOLN: 1.0000 mL/kg/h | INTRAVENOUS | Status: DC

## 2015-03-07 MED ORDER — MORPHINE SULFATE 2 MG/ML IJ SOLN: 1.0000 mg | INTRAMUSCULAR | Status: DC | PRN

## 2015-03-07 MED ORDER — HEPARIN SODIUM (PORCINE) 1000 UNIT/ML IJ SOLN
INTRAMUSCULAR | Status: AC
  Filled 2015-03-07: qty 1

## 2015-03-07 MED ORDER — SODIUM CHLORIDE 0.9 % IJ SOLN: 3.0000 mL | INTRAMUSCULAR | Status: DC | PRN

## 2015-03-07 MED ORDER — SODIUM CHLORIDE 0.9 % IV SOLN
INTRAVENOUS | Status: DC
  Administered 2015-03-07: 10:00:00 via INTRAVENOUS

## 2015-03-07 MED ORDER — LIDOCAINE HCL (PF) 1 % IJ SOLN
INTRAMUSCULAR | Status: AC
  Filled 2015-03-07: qty 30

## 2015-03-07 MED ORDER — ASPIRIN 81 MG PO CHEW: 81.0000 mg | CHEWABLE_TABLET | ORAL | Status: DC

## 2015-03-07 MED ORDER — VERAPAMIL HCL 2.5 MG/ML IV SOLN
INTRAVENOUS | Status: AC
  Filled 2015-03-07: qty 2

## 2015-03-07 MED ORDER — HEPARIN (PORCINE) IN NACL 2-0.9 UNIT/ML-% IJ SOLN
INTRAMUSCULAR | Status: AC
  Filled 2015-03-07: qty 1000

## 2015-03-07 MED ORDER — ACETAMINOPHEN 325 MG PO TABS: 650.0000 mg | ORAL_TABLET | ORAL | Status: DC | PRN

## 2015-03-07 MED ORDER — ONDANSETRON HCL 4 MG/2ML IJ SOLN: 4.0000 mg | Freq: Four times a day (QID) | INTRAMUSCULAR | Status: DC | PRN

## 2015-03-07 MED ORDER — OXYCODONE-ACETAMINOPHEN 5-325 MG PO TABS: 1.0000 | ORAL_TABLET | ORAL | Status: DC | PRN

## 2015-03-07 NOTE — CV Procedure (Signed)
CARDIAC CATHETERIZATION REPORT  Riley Grant   [id number] 01/12/1971  Performing Cardiologist: Pixie Casino Primary Physician: Imagene Riches, NP Primary Cardiologist:  Vennie Salsbury  Procedures Performed:  Left Heart Catheterization via 5 Fr right femoral artery access  Left Ventriculography, (RAO/LAO) 15 ml/sec for 30 ml total contrast  Native Coronary Angiography  Indication(s): chest pain  Pre-Procedural Diagnosis(es):  1. CCS 3 angina, prior LAD stent  Post-Procedural Diagnosis(es): 1. Mild distal ISR <20% of the proximal LAD stent 2. No angiographically significant CAD otherwise  Pre-Procedural Non-invasive testing: none  History: 44 y.o. male presented with is a 44 year old male (who self-reports that he has a fifth grade education and cannot read or write), who is here to establish cardiovascular care. He was previously seen by cardiologist and in 2012 underwent heart catheterization after suffering 2 transient ischemic attacks. No etiology was found. He then apparently had stress testing which was not remarkable, but persisted in having chest pain and had a heart catheterization which shows a 70% proximal LAD stenosis. I reviewed a print out that was provided from the heart catheterization as well as a post PCI photo demonstrating a stent in the proximal LAD. It appears he had a very good result. He said he felt markedly better for about 5 days after the procedure and then started going downhill after that. He reports progressive shortness of breath, fatigue, poor exercise tolerance, feeling of bloating and early satiety, but normal bowel movements. He has undergone several tests as well as blood work which have only revealed a low testosterone. It is not clear whether this is low total testosterone or whether his free testosterone was checked, but it would be worthwhile doing this. His family history is significant for father who had an MI in her grandfather who had died of  sudden cardiac death at age 1. He also reports leg pain, cold feet, lightheadedness and headaches. He reports poor sleep at night and his wife notes that he snores and occasionally stops breathing which is concerning for a short sleep apnea.  I the pleasure see Riley Grant in the office today. He tells me that he's had several episodes of significant chest pain including one associated with diaphoresis for which he felt faint and almost passed out, however the symptoms quickly subsided. He's also had marked fatigue, which was addressed at his last office visit. He says that after he had a stent in 2012, he felt like a new man for about 6 months that is felt progressively more fatigued with almost no energy for the past 4 years. I referred him for an exercise cardio metabolic stress test last year which he underwent and did extremely well. There is no evidence of any ischemia, however he said he felt exhausted for several days afterwards. He now feels like that he wears out with very little activity. Today he brought a battery of tests performed by his primary care provider including a genetic tests and it expanded lipid profile. I reviewed these tests in detail and spent about 45 minutes in total. The highlights include an abnormal lipid profile, which is known. Riley Grant unfortunately was intolerant to both Lipitor and Crestor due to myalgias. This may also be related to vitamin D deficiency. He was noted to have a vitamin D level of 18. He also has a mildly elevated homocystine level and elevated APoB and LP-PLA2. He is a rapid metabolizer of Plavix therefore normal dosing is appropriate. EKG today shows normal sinus rhythm  without ischemic changes. He was recently diagnosed with obstructive sleep apnea that was moderate, and was fitted with CPAP. He says that that actually made him feel worse and he was claustrophobic with it and has refused to wear it.  Risks / Complications include, but not limited  to: Death, MI, CVA/TIA, VF/VT (with defibrillation), Bradycardia (need for temporary pacer placement), contrast induced nephropathy, bleeding / bruising / hematoma / pseudoaneurysm, vascular or coronary injury (with possible emergent CT or Vascular Surgery), adverse medication reactions, infection.    Consent: Risks of procedure as well as the alternatives and risks of each were explained to the (patient/caregiver).  Consent for procedure obtained.  Procedure: The patient was brought to the [facility] in the fasting state and prepped and draped in the usual sterile fashion for (Right groin or radial) access. A modified Allen's test with plethysmography was performed on the right wrist demonstrating adequate Ulnar Artery collateral flow.    Time Out: Verified patient identification, verified procedure, site/side was marked, verified correct patient position, special equipment/implants available, radiation safety measures in place (including badges and shielding), medications/allergies/relevent history reviewed, required imaging and test results available.  Performed  Procedure: The right wrist was anesthetized with 1% subcutaneous Lidocaine.  The right radial artery was accessed using the Seldinger Technique with placement of a 6 Fr Glide Sheath. The sheath was aspirated and flushed.  Then a total of 20 ml of standard Radial Artery Cocktail (see medications) was infused.  A 5 Fr TIG 4.0 Catheter was advanced of over a Safety J wire, however met resistance at the right elbow. The wire was exchanged for a versicore wire which was advanced past the shoulder, however, the catheter was fixed at the elbow. Additional cocktail was given through the catheter and the patient was given more sedation to treat presumed catheter spasm. After about 5 minutes, the catheter was connected to pressure which showed a damped, non-pulsatile waveform. The catheter could not be advanced, but  could slowly be withdrawn without significant pain. Eventually, the cathter and wire were completely removed. The sheath was removed in the Cath Lab with a TR band placed at 15 ml Air at 1600 (time).  Reverse Allen's test did reveal non-occlusive hemostasis.  The right femoral head was identified using tactile and fluoroscopic technique.  The right groin was anesthetized with 1% subcutaneous Lidocaine.  The right Common Femoral Artery was accessed using the Modified Seldinger Technique with placement of (5 Fr) sheath using the Seldinger technique.  The sheath was aspirated and flushed.  A 5 Fr JL4 Catheter was advanced of over a Standard J wire into the ascending Aorta.  The catheter was used to engage the left coronary artery.  Multiple cineangiographic views of the left coronary artery system(s) were performed. A 5 Fr JR4 Catheter was advanced of over a Safety J wire into the ascending Aorta.  The catheter was used to engage the right coronary artery.  Multiple cineangiographic views of the right coronary artery system(s) were performed. This catheter was then exchanged over the Standard J wire for an angled Pigtail catheter that was advanced across the Aortic Valve.  LV hemodynamics were measured (and Left Ventriculography was performed).  LV hemodynamics were then re-sampled, and the catheter was pulled Grant across the Aortic Valve for measurement of "pull-Grant" gradient.  The catheter and the wire was removed completely out of the body. The patient was transferred to the holding area where the sheath was removed with  manual pressure held for hemostasis.   Recovery: The patient was transported to the cath lab holding area in stable condition.   The patient  was stable before, during and following the procedure.   Patient did tolerate procedure well. There were not complications.  EBL: less than 50 mL  Medications:  Premedication: none  Sedation:  3 mg IV Versed, 25 mcg IV Fentanyl  Contrast:  65 ml  Omnipaque  Local Anesthesia: 3 cc 1% lidocaine  5000 U IV heparin  20 cc Radial cocktail  250 cc normal saline bolus  Hemodynamics:  Central Aortic Pressure / Mean Aortic Pressure: 114/70  LV Pressure / LV End diastolic Pressure:  7  Left Ventriculography:  EF:  60-65%   Wall Motion: Normal  MR: 0  Coronary Angiographic Data:  Left Main:  Angiographically normal. Bifurcates into the LAD and LCx in the normal fashion.  Left Anterior Descending (LAD):  Long vessel, probably 4.0-4.5 mm in size, tapers around the apex. There is a proximally placed stent with ~20% distal ISR.  1st diagonal (D1):  Smaller vessel, no stenosis.  Circumflex (LCx):  Large vessel without angiographic stenosis. 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  1st obtuse marginal:  No stenosis.  Right Coronary Artery: Dominant vessel. No stenosis.  right ventricle branch of right coronary artery: normal  posterior descending artery: No stenosis.  posterior lateral branch:  No stenosis.  Impression: 1.  Mild ISR (20%) of the distal portion of the previously placed stent in the proximal LAD. 2.  Otherwise, angiographically normal coronaries 3.  LVEDP = 66mHg 4.  LVEF 60-65%, normal wall motion.  Plan: 1.  Continue current cardiac medications. 2.  Further non-cardiac work-up for the patients's symptoms per his PCP. 3.  Follow-up with me in 1-2 weeks for site check.  The case and results was discussed with the patient and family if available.  The case and results was not discussed with the patient's PCP. The case and results was discussed with the patient's Cardiologist.  Time Spent Directly with the Patient:  60 minutes  KPixie Casino MD, FHemet EndoscopyAttending Cardiologist [facility]  Iveth Heidemann C 03/07/2015, 3:46 PM

## 2015-03-07 NOTE — Discharge Instructions (Signed)
Transradial Angiography Angiography is a procedure used to look at the blood vessels. Blood vessels are tubes that carry blood to different parts of the body. During angiography, dye is injected through a long, thin tube (catheter) into an artery. An artery is a type of blood vessel that carries blood from the heart to an organ or other body part. Transradial angiography refers to using the radial artery, located in the wrist, as the access site or where the catheter is inserted. The tip of the catheter is manipulated from there to the blood vessels the caregiver needs to see, such as vessels that feed the heart, brain, legs, or other organs.After the dye is injected, X-rays are taken. The X-rays show if there is problem such as a blockage in a blood vessel.  The procedure is usually performed in a hospital and takes about 30 minutes to complete, though it may take longer. LET YOUR CAREGIVER KNOW ABOUT:  Allergies, including allergies to certain dyes.   Medicines taken, including vitamins, herbs, eyedrops, over-the-counter medicines, and creams. Also, let your caregiver know if you are taking aspirin or other medicines that may affect blood clotting.   Use of steroids (by mouth or creams).   Previous problems with anesthetics or other numbing medicines.   History of bleeding problems or blood clots.   Smoking history.   Previous surgery.   Other health problems, including diabetes and kidney problems.   Any recent infections or fevers.  Possibility of pregnancy.  RISKS AND COMPLICATIONS As with any procedure, complications may occur, but they can usually be managed by your caregiver. Complications may include:   Reaction to anesthetics.   Damage to surrounding nerves, tissues, or structures.   Infection.   Blood clots.   Scarring.  The following complications may also occur, but are rare:   Losing too much blood.   Blood flow through the radial artery slows  down or stops.   Your caregiver cannot find or use your radial artery.   The procedure does not work. BEFORE THE PROCEDURE   You may need to have a physical exam. You may also need to take some tests, such as blood, blood pressure, or imaging tests.   Ask your caregiver about changing or stopping your regular medicines.  Avoid wearing heavy makeup, jewelry, and hair accessories the day of the procedure.   Stop smoking at least 24 hours before the procedure.  Make plans to have someone drive you home after the procedure.  Do not eat or drink 7-8 hours before the procedure. Ask if it is okay to take any needed medicine with a small sip of water. PROCEDURE   You will be given a medicine called a local anesthetic to make the wrist area numb. You may also be given a medicine to help you relax (sedative) through an intravenous (IV) access tube in your hand or arm.  The area on your wrist where a flexible tube (catheter) will be inserted will be washed and shaved.  A tiny needle will be inserted through the skin into the radial artery and a guide wire will be inserted. The catheter will then be inserted over the wire and advanced to the desired location using a type of X-ray procedure called fluoroscopy.  Dye will then be injected and X-rays will be taken. The X-rays will show where any narrowing or blockages are located in the blood vessels.  At the end of the procedure, a compression device will be applied to the  wrist to prevent bleeding. AFTER THE PROCEDURE  You will need to keep your wrist still until your caregiver says it is okay to move. This will usually be within a few hours of the procedure ending. While you are recovering, the caregiver may:   Monitor you frequently.   Gradually decrease the pressure on the compression device.   Take blood tests, other X-rays, and electrocardiography. You may be able to go home the same day or may need to stay in the hospital  overnight for observation. Your caregiver will decide if that is needed. You may feel a little soreness and notice bruising at the incision site. You may also feel mild tingling of the hand. This is normal and will go away in a few days.  Document Released: 09/02/2012 Document Reviewed: 09/02/2012 [facility] Patient Information 2015 [place]. This information is not intended to replace advice given to you by your health care provider. Make sure you discuss any questions you have with your health care provider.  Coronary Angiogram A coronary angiogram, also called coronary angiography, is an X-ray procedure used to look at the arteries in the heart. In this procedure, a dye (contrast dye) is injected through a long, hollow tube (catheter). The catheter is about the size of a piece of cooked spaghetti and is inserted through your groin, wrist, or arm. The dye is injected into each artery, and X-rays are then taken to show if there is a blockage in the arteries of your heart. LET [facility] CARE PROVIDER KNOW ABOUT:  Any allergies you have, including allergies to shellfish or contrast dye.   All medicines you are taking, including vitamins, herbs, eye drops, creams, and over-the-counter medicines.   Previous problems you or members of your family have had with the use of anesthetics.   Any blood disorders you have.   Previous surgeries you have had.  History of kidney problems or failure.   Other medical conditions you have. RISKS AND COMPLICATIONS  Generally, a coronary angiogram is a safe procedure. However, problems can occur and include:  Allergic reaction to the dye.  Bleeding from the access site or other locations.  Kidney injury, especially in people with impaired kidney function.  Stroke (rare).  Heart attack (rare). BEFORE THE PROCEDURE   Do not eat or drink anything after midnight the night before the procedure or as directed by your health care provider.    Ask your health care provider about changing or stopping your regular medicines. This is especially important if you are taking diabetes medicines or blood thinners. PROCEDURE  You may be given a medicine to help you relax (sedative) before the procedure. This medicine is given through an intravenous (IV) access tube that is inserted into one of your veins.   The area where the catheter will be inserted will be washed and shaved. This is usually done in the groin but may be done in the fold of your arm (near your elbow) or in the wrist.   A medicine will be given to numb the area where the catheter will be inserted (local anesthetic).   The health care provider will insert the catheter into an artery. The catheter will be guided by using a special type of X-ray (fluoroscopy) of the blood vessel being examined.   A special dye will then be injected into the catheter, and X-rays will be taken. The dye will help to show where any narrowing or blockages are located in the heart arteries.  AFTER THE PROCEDURE   If the procedure is done through the leg, you will be kept in bed lying flat for several hours. You will be instructed to not bend or cross your legs.  The insertion site will be checked frequently.   The pulse in your feet or wrist will be checked frequently.   Additional blood tests, X-rays, and an electrocardiogram may be done.  Document Released: 06/15/2003 Document Revised: 04/25/2014 Document Reviewed: 05/03/2013 [facility] Patient Information 2015 [place], [place]. This information is not intended to replace advice given to you by your health care provider. Make sure you discuss any questions you have with your health care provider. Radial Site Care Refer to this sheet in the next few weeks. These instructions provide you with information on caring for yourself after your procedure. Your caregiver may also give you more specific instructions. Your treatment has been  planned according to current medical practices, but problems sometimes occur. Call your caregiver if you have any problems or questions after your procedure. HOME CARE INSTRUCTIONS  You may shower the day after the procedure.Remove the bandage (dressing) and gently wash the site with plain soap and water.Gently pat the site dry.  Do not apply powder or lotion to the site.  Do not submerge the affected site in water for 3 to 5 days.  Inspect the site at least twice daily.  Do not flex or bend the affected arm for 24 hours.  No lifting over 5 pounds (2.3 kg) for 5 days after your procedure.  Do not drive home if you are discharged the same day of the procedure. Have someone else drive you.  You may drive 24 hours after the procedure unless otherwise instructed by your caregiver.  Do not operate machinery or power tools for 24 hours.  A responsible adult should be with you for the first 24 hours after you arrive home. What to expect:  Any bruising will usually fade within 1 to 2 weeks.  Blood that collects in the tissue (hematoma) may be painful to the touch. It should usually decrease in size and tenderness within 1 to 2 weeks. SEEK IMMEDIATE MEDICAL CARE IF:  You have unusual pain at the radial site.  You have redness, warmth, swelling, or pain at the radial site.  You have drainage (other than a small amount of blood on the dressing).  You have chills.  You have a fever or persistent symptoms for more than 72 hours.  You have a fever and your symptoms suddenly get worse.  Your arm becomes pale, cool, tingly, or numb.  You have heavy bleeding from the site. Hold pressure on the site. Document Released: 01/11/2011 Document Revised: 03/02/2012 Document Reviewed: 01/11/2011 [facility] Patient Information 2015 [place], [place]. This information is not intended to replace advice given to you by your health care provider. Make sure you discuss any questions you have with your  health care provider. Angiogram, Care After Refer to this sheet in the next few weeks. These instructions provide you with information on caring for yourself after your procedure. Your health care provider may also give you more specific instructions. Your treatment has been planned according to current medical practices, but problems sometimes occur. Call your health care provider if you have any problems or questions after your procedure.  WHAT TO EXPECT AFTER THE PROCEDURE After your procedure, it is typical to have the following sensations:  Minor discomfort or tenderness and a small bump at the catheter insertion site. The bump  should usually decrease in size and tenderness within 1 to 2 weeks.  Any bruising will usually fade within 2 to 4 weeks. HOME CARE INSTRUCTIONS   You may need to keep taking blood thinners if they were prescribed for you. Take medicines only as directed by your health care provider.  Do not apply powder or lotion to the site.  Do not take baths, swim, or use a hot tub until your health care provider approves.  You may shower 24 hours after the procedure. Remove the bandage (dressing) and gently wash the site with plain soap and water. Gently pat the site dry.  Inspect the site at least twice daily.  Limit your activity for the first 48 hours. Do not bend, squat, or lift anything over 20 lb (9 kg) or as directed by your health care provider.  Plan to have someone take you home after the procedure. Follow instructions about when you can drive or return to work. SEEK MEDICAL CARE IF:  You get light-headed when standing up.  You have drainage (other than a small amount of blood on the dressing).  You have chills.  You have a fever.  You have redness, warmth, swelling, or pain at the insertion site. SEEK IMMEDIATE MEDICAL CARE IF:   You develop chest pain or shortness of breath, feel faint, or pass out.  You have bleeding, swelling larger than a walnut,  or drainage from the catheter insertion site.  You develop pain, discoloration, coldness, or severe bruising in the leg or arm that held the catheter.  You develop bleeding from any other place, such as the bowels. You may see bright red blood in your urine or stools, or your stools may appear black and tarry.  You have heavy bleeding from the site. If this happens, hold pressure on the site. MAKE SURE YOU:  Understand these instructions.  Will watch your condition.  Will get help right away if you are not doing well or get worse. Document Released: 06/27/2005 Document Revised: 04/25/2014 Document Reviewed: 05/03/2013 [facility] Patient Information 2015 [place], [place]. This information is not intended to replace advice given to you by your health care provider. Make sure you discuss any questions you have with your health care provider.

## 2015-03-07 NOTE — H&P (Signed)
     INTERVAL PROCEDURE H&P  History and Physical Interval Note:  03/07/2015 11:39 AM  Sherill L Ahlgrim has presented today for their planned procedure. The various methods of treatment have been discussed with the patient and family. After consideration of risks, benefits and other options for treatment, the patient has consented to the procedure.  The patients' outpatient history has been reviewed, patient examined, and no change in status from most recent office note within the past 30 days. I have reviewed the patients' chart and labs and will proceed as planned. Questions were answered to the patient's satisfaction.   Cath Lab Visit (complete for each Cath Lab visit)  Clinical Evaluation Leading to the Procedure:   ACS: No.  Non-ACS:    Anginal Classification: CCS III  Anti-ischemic medical therapy: Maximal Therapy (2 or more classes of medications)  Non-Invasive Test Results: No non-invasive testing performed  Prior CABG: No previous CABG   Pixie Casino, MD, [facility] Attending Cardiologist [facility]  Trenisha Lafavor C 03/07/2015, 11:39 AM

## 2015-03-08 ENCOUNTER — Encounter (HOSPITAL_COMMUNITY): Payer: Self-pay | Admitting: Internal Medicine

## 2015-03-09 ENCOUNTER — Encounter: Payer: Self-pay | Admitting: Internal Medicine

## 2015-03-23 ENCOUNTER — Ambulatory Visit (INDEPENDENT_AMBULATORY_CARE_PROVIDER_SITE_OTHER): Payer: 59 | Admitting: Internal Medicine

## 2015-03-23 ENCOUNTER — Encounter: Payer: Self-pay | Admitting: Internal Medicine

## 2015-03-23 VITALS — BP 112/80 | HR 64 | Ht 68.0 in | Wt 254.3 lb

## 2015-03-23 DIAGNOSIS — R5383 Other fatigue: Secondary | ICD-10-CM | POA: Diagnosis not present

## 2015-03-23 DIAGNOSIS — I2510 Atherosclerotic heart disease of native coronary artery without angina pectoris: Secondary | ICD-10-CM

## 2015-03-23 DIAGNOSIS — I2583 Coronary atherosclerosis due to lipid rich plaque: Secondary | ICD-10-CM

## 2015-03-23 DIAGNOSIS — I10 Essential (primary) hypertension: Secondary | ICD-10-CM

## 2015-03-23 DIAGNOSIS — R0683 Snoring: Secondary | ICD-10-CM | POA: Diagnosis not present

## 2015-03-23 MED ORDER — VITAMIN D (ERGOCALCIFEROL) 1.25 MG (50000 UNIT) PO CAPS: 50000.0000 [IU] | ORAL_CAPSULE | ORAL | Status: DC

## 2015-03-23 NOTE — Patient Instructions (Signed)
Please call [facility] regarding scheduling your sleep test - [phone]  Medication Changes 1. INCREASE vitamin D to 50,000 units once a week for 6 weeks  >> After 6 weeks, decrease your vitamin D to 2,000 units daily (over the counter) 2. STOP plavix 3. STOP imdur (isosorbide)  4. DECREASE metoprolol succinate (toprol) to 12.5mg  or 1/2 tablet daily   Your physician wants you to follow-up in: 1 year with Dr. Debara Pickett. You will receive a reminder letter in the mail two months in advance. If you don't receive a letter, please call our office to schedule the follow-up appointment.

## 2015-03-23 NOTE — Progress Notes (Signed)
OFFICE NOTE  Chief Complaint:  Persistent fatigue  Primary Care Physician: Imagene Riches, NP  HPI:  Riley Grant is a 44 year old male (who self-reports that he has a fifth grade education and cannot read or write), who is here to establish cardiovascular care. He was previously seen by cardiologist and in 2012 underwent heart catheterization after suffering 2 transient ischemic attacks. No etiology was found. He then apparently had stress testing which was not remarkable, but persisted in having chest pain and had a heart catheterization which shows a 70% proximal LAD stenosis. I reviewed a print out that was provided from the heart catheterization as well as a post PCI photo demonstrating a stent in the proximal LAD. It appears he had a very good result. He said he felt markedly better for about 5 days after the procedure and then started going downhill after that. He reports progressive shortness of breath, fatigue, poor exercise tolerance, feeling of bloating and early satiety, but normal bowel movements. He has undergone several tests as well as blood work which have only revealed a low testosterone. It is not clear whether this is low total testosterone or whether his free testosterone was checked, but it would be worthwhile doing this. His family history is significant for father who had an MI in her grandfather who had died of sudden cardiac death at age 66. He also reports leg pain, cold feet, lightheadedness and headaches.  He reports poor sleep at night and his wife notes that he snores and occasionally stops breathing which is concerning for a short sleep apnea.  I the pleasure see Riley Grant back in the office today. He tells me that he's had several episodes of significant chest pain including one associated with diaphoresis for which he felt faint and almost passed out, however the symptoms quickly subsided. He's also had marked fatigue, which was addressed at his last office visit.  He says that after he had a stent in 2012, he felt like a new man for about 6 months that is felt progressively more fatigued with almost no energy for the past 4 years. I referred him for an exercise cardio metabolic stress test last year which he underwent and did extremely well. There is no evidence of any ischemia, however he said he felt exhausted for several days afterwards. He now feels like that he wears out with very little activity. Today he brought a battery of tests performed by his primary care provider including a genetic tests and it expanded lipid profile. I reviewed these tests in detail and spent about 45 minutes in total. The highlights include an abnormal lipid profile, which is known. Riley Grant unfortunately was intolerant to both Lipitor and Crestor due to myalgias. This may also be related to vitamin D deficiency. He was noted to have a vitamin D level of 18. He also has a mildly elevated homocystine level and elevated APoB and LP-PLA2. He is a rapid metabolizer of Plavix therefore normal dosing is appropriate. EKG today shows normal sinus rhythm without ischemic changes. He was recently diagnosed with obstructive sleep apnea that was moderate, and was fitted with CPAP. He says that that actually made him feel worse and he was claustrophobic with it and has refused to wear it.  Riley Grant returns today for follow-up of his heart catheterization. This demonstrated a patent stent with less than 20% distal in-stent restenosis. No new obstructive coronary disease was noted. Overall LV function is normal. Based on this  results, I think it's very unlikely that any of his fatigue and symptoms are related to new coronary artery disease. He certainly reported a marked improvement in his fatigue after having a stent, but at this point I feel there is another cause of his fatigue.  We talked about medication adjustments today and I think that's worth pursuing.   PMHx:  Past Medical History    Diagnosis Date  . History of TIAs   . Heart attack     Past Surgical History  Procedure Laterality Date  . Left heart catheterization with coronary angiogram N/A 03/07/2015    Procedure: LEFT HEART CATHETERIZATION WITH CORONARY ANGIOGRAM;  Surgeon: Pixie Casino, MD;  Location: [facility] CATH LAB;  Service: Cardiovascular;  Laterality: N/A;    FAMHx:  Family History  Problem Relation Age of Onset  . Hypertension Father   . Stroke Father   . Hypertension Maternal Grandmother   . Cancer Maternal Grandmother   . Hypertension Paternal Grandmother   . Alzheimer's disease Paternal Grandmother     SOCHx:   reports that he has never smoked. He has never used smokeless tobacco. He reports that he does not drink alcohol or use illicit drugs.  ALLERGIES:  Allergies  Allergen Reactions  . Crestor [Rosuvastatin] Other (See Comments)    Myalgias= muscle cramp  . Lipitor [Atorvastatin] Other (See Comments)    Myalgias= muscle cramp    ROS: A comprehensive review of systems was negative except for: Constitutional: positive for fatigue Behavioral/Psych: positive for anxiety  HOME MEDS: Current Outpatient Prescriptions  Medication Sig Dispense Refill  . aspirin EC 81 MG tablet Take 81 mg by mouth daily.    . folic acid (FOLVITE) 644 MCG tablet Take 400 mcg by mouth daily.    Marland Kitchen HYDROcodone-acetaminophen (NORCO/VICODIN) 5-325 MG per tablet Take 1 tablet by mouth every 4 (four) hours as needed for moderate pain.     Marland Kitchen lisinopril (PRINIVIL,ZESTRIL) 20 MG tablet Take 1 tablet by mouth daily.    Marland Kitchen loratadine (CLARITIN) 10 MG tablet Take 10 mg by mouth daily.    . metoprolol succinate (TOPROL-XL) 25 MG 24 hr tablet Take 12.5 mg by mouth daily.     . nitroGLYCERIN (NITROSTAT) 0.3 MG SL tablet Place 0.3 mg under the tongue every 5 (five) minutes as needed for chest pain.    Marland Kitchen OVER THE COUNTER MEDICATION Renafood - take 1 tablet by mouth once daily.    . ranitidine (ZANTAC) 150 MG tablet Take 150 mg  by mouth daily.    . vitamin B-12 (CYANOCOBALAMIN) 1000 MCG tablet Take 1,000 mcg by mouth daily.    . Vitamin D, Cholecalciferol, 1000 UNITS CAPS Take 2 capsules by mouth.     . Vitamin D, Ergocalciferol, (DRISDOL) 50000 UNITS CAPS capsule Take 1 capsule (50,000 Units total) by mouth every 7 (seven) days. For 6 weeks. 6 capsule 0   No current facility-administered medications for this visit.    LABS/IMAGING: No results found for this or any previous visit (from the past 48 hour(s)). No results found.  VITALS: BP 112/80 mmHg  Pulse 64  Ht 5\' 8"  (1.727 m)  Wt 254 lb 4.8 oz (115.35 kg)  BMI 38.68 kg/m2  EXAM: Deferred  EKG: Deferred  ASSESSMENT: 1. Fatigue, chest pain, shortness of breath 2. Coronary artery disease status post PCI to the proximal LAD - patent stent with no new obstructive coronary disease by recent catheterization 3. Hypertension 4. OSA-intolerant of CPAP 5. Dyslipidemia-intolerant to Lipitor and  Crestor  PLAN: 1.   Riley Grant continues to have significant fatigue. His chest pain seems to have resolved after his heart catheterization demonstrated patent coronaries with an intact stent. He recently had some genetic testing which suggested he was a rapid metabolizer of clopidogrel. On this may mean is less effective, he certainly had no significant thrombosis or event related to his stent placement which was several years ago. At this point, did not see clear indication to continue on Plavix and I would discontinue it. In addition I think we can discontinue his long-acting nitrate. He was noted to have a severely low vitamin D level less than 20, therefore is recommended that he have high-dose vitamin D replacement. I'll go ahead and order 50,000 units weekly for 6 weeks and then he should switch to 2000 mg daily. Testing indicated a low testosterone through his primary care provider. With no recurrent significant coronary disease I think is reasonable to consider  testosterone replacement therapy. In addition I'm strongly encouraging him to schedule his sleep study which was ordered at his previous office visit. If there is an element of sleep apnea and that is treated it may significantly help his symptoms. I'll also decrease his metoprolol XL to 12.5 mg daily, to see if the beta blockers playing any role in his fatigue.  I've encouraged continued workup of his fatigue through his primary care provider. Plan to see him back in 6 months.  Pixie Casino, MD, [facility] Attending Cardiologist [facility]  Taijah Macrae C 03/23/2015, 1:10 PM

## 2015-12-24 DIAGNOSIS — G43909 Migraine, unspecified, not intractable, without status migrainosus: Secondary | ICD-10-CM | POA: Insufficient documentation

## 2015-12-24 HISTORY — DX: Migraine, unspecified, not intractable, without status migrainosus: G43.909

## 2016-05-01 DIAGNOSIS — R200 Anesthesia of skin: Secondary | ICD-10-CM

## 2016-05-01 DIAGNOSIS — M542 Cervicalgia: Secondary | ICD-10-CM | POA: Insufficient documentation

## 2016-05-01 DIAGNOSIS — G44319 Acute post-traumatic headache, not intractable: Secondary | ICD-10-CM

## 2016-05-01 HISTORY — DX: Anesthesia of skin: R20.0

## 2016-05-01 HISTORY — DX: Cervicalgia: M54.2

## 2016-05-01 HISTORY — DX: Acute post-traumatic headache, not intractable: G44.319

## 2016-12-19 ENCOUNTER — Telehealth: Payer: Self-pay | Admitting: Physician Assistant

## 2016-12-19 NOTE — Telephone Encounter (Signed)
Received records from [place] for appointment on 12/20/16 with Rosaria Ferries, PA.  Records given to [employer] (medical records) for Rhonda's schedule on 12/20/16. lp

## 2016-12-20 ENCOUNTER — Ambulatory Visit (INDEPENDENT_AMBULATORY_CARE_PROVIDER_SITE_OTHER): Payer: BLUE CROSS/BLUE SHIELD | Admitting: Physician Assistant

## 2016-12-20 ENCOUNTER — Encounter: Payer: Self-pay | Admitting: Physician Assistant

## 2016-12-20 VITALS — BP 140/84 | HR 77 | Ht 68.0 in | Wt 259.0 lb

## 2016-12-20 DIAGNOSIS — R0602 Shortness of breath: Secondary | ICD-10-CM | POA: Diagnosis not present

## 2016-12-20 DIAGNOSIS — R0609 Other forms of dyspnea: Secondary | ICD-10-CM | POA: Diagnosis not present

## 2016-12-20 DIAGNOSIS — I10 Essential (primary) hypertension: Secondary | ICD-10-CM | POA: Diagnosis not present

## 2016-12-20 NOTE — Patient Instructions (Addendum)
Medication Instructions:  NO CHANGES  If you need a refill on your cardiac medications before your next appointment, please call your pharmacy.  Labwork: BMET OK TO HAVE DONE WITH YOUR PRIMARY CARE MD NEXT WEEK  Testing/Procedures: Non-Cardiac CT Angiography (CTA) FOR PULMONARY EMBOLUS, is a special type of CT scan that uses a computer to produce multi-dimensional views of major blood vessels throughout the body. In CT angiography, a contrast material is injected through an IV to help visualize the blood vessels  Follow-Up: Your physician recommends that you schedule a follow-up appointment in: MARCH AS REQUESTED BY DR HILTY   Special Instructions: PLEASE HAVE PRIMARY MD FAX LAB WORK FROM NEXT WEEK TO [place] AT [phone]  CALL AFTER KNEE SURGERY TO HAVE MONITOR SCHEDULED     Thank you for choosing [facility] at [employer], LPN
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
## 2016-12-23 HISTORY — PX: TOTAL KNEE ARTHROPLASTY: SHX125

## 2016-12-26 ENCOUNTER — Ambulatory Visit (HOSPITAL_COMMUNITY)
Admission: RE | Admit: 2016-12-26 | Discharge: 2016-12-26 | Disposition: A | Discharge status: Home / Self Care | Payer: BLUE CROSS/BLUE SHIELD | Source: Ambulatory Visit | Attending: Physician Assistant | Admitting: Physician Assistant

## 2016-12-26 DIAGNOSIS — R0602 Shortness of breath: Secondary | ICD-10-CM | POA: Insufficient documentation

## 2016-12-26 DIAGNOSIS — K229 Disease of esophagus, unspecified: Secondary | ICD-10-CM | POA: Insufficient documentation

## 2016-12-26 MED ORDER — IOPAMIDOL (ISOVUE-370) INJECTION 76%
100.0000 mL | Freq: Once | INTRAVENOUS | Status: AC | PRN
  Administered 2016-12-26: 100 mL via INTRAVENOUS

## 2016-12-27 ENCOUNTER — Telehealth: Payer: Self-pay | Admitting: Internal Medicine

## 2016-12-27 MED ORDER — PANTOPRAZOLE SODIUM 40 MG PO TBEC: 40.0000 mg | DELAYED_RELEASE_TABLET | Freq: Every day | ORAL | 11 refills | Status: DC

## 2016-12-27 NOTE — Telephone Encounter (Signed)
Pt had a CT yesterday,he wants to know how can he get his results?

## 2016-12-27 NOTE — Telephone Encounter (Signed)
Returned call and discussed CT findings, recommendations for medical therapy as advised by R Barrett. All questions regarding test findings were addressed to patient's satisfaction. Pt voiced understanding and thanks.

## 2016-12-30 ENCOUNTER — Telehealth: Payer: Self-pay | Admitting: Internal Medicine

## 2016-12-30 DIAGNOSIS — I252 Old myocardial infarction: Secondary | ICD-10-CM

## 2016-12-30 DIAGNOSIS — R5383 Other fatigue: Secondary | ICD-10-CM

## 2016-12-30 DIAGNOSIS — R0609 Other forms of dyspnea: Principal | ICD-10-CM

## 2016-12-30 NOTE — Telephone Encounter (Signed)
New Message    Please call with test results from 12/26/16

## 2016-12-30 NOTE — Telephone Encounter (Signed)
Request for surgical clearance:  1. What type of surgery is being performed? Right knee replacement   2. When is this surgery scheduled? 01/14/17    3. Are there any medications that need to be held prior to surgery and how long?  ASA  4. Name of physician performing surgery? Dr Samule Dry   5. What is your office phone and fax number? [phone] ext [phone] fax [phone]   Will forward to [place]

## 2016-12-31 NOTE — Telephone Encounter (Signed)
Pt of Dr Debara Pickett His CT showed possible reflux Ovid Curd already called him) Please let him know that if he is still having DOE.  We should do a stress test before his surgery. Lexiscan MV, will be a 2-day study.

## 2016-12-31 NOTE — Telephone Encounter (Signed)
PT NOTIFIED-IMAGING WILL CALL TO SCHEDULE THIS IS FOR PRE-OP CLEARANCE

## 2017-01-02 ENCOUNTER — Telehealth (HOSPITAL_COMMUNITY): Payer: Self-pay

## 2017-01-02 NOTE — Telephone Encounter (Signed)
Encounter complete. 

## 2017-01-02 NOTE — Telephone Encounter (Signed)
Scheduled 10/24/17 °

## 2017-01-03 ENCOUNTER — Ambulatory Visit (HOSPITAL_COMMUNITY)
Admission: RE | Admit: 2017-01-03 | Discharge: 2017-01-03 | Disposition: A | Discharge status: Home / Self Care | Payer: BLUE CROSS/BLUE SHIELD | Source: Ambulatory Visit | Attending: Internal Medicine | Admitting: Internal Medicine

## 2017-01-03 DIAGNOSIS — I252 Old myocardial infarction: Secondary | ICD-10-CM

## 2017-01-03 DIAGNOSIS — R5383 Other fatigue: Secondary | ICD-10-CM | POA: Insufficient documentation

## 2017-01-03 DIAGNOSIS — R0609 Other forms of dyspnea: Secondary | ICD-10-CM | POA: Diagnosis not present

## 2017-01-03 LAB — MYOCARDIAL PERFUSION IMAGING
CHL CUP NUCLEAR SDS: 1
CHL CUP RESTING HR STRESS: 69 {beats}/min
CSEPPHR: 86 {beats}/min
LV dias vol: 139 mL (ref 62–150)
LV sys vol: 64 mL
SRS: 1
SSS: 2
TID: 1.28

## 2017-01-03 MED ORDER — TECHNETIUM TC 99M TETROFOSMIN IV KIT
31.8000 | PACK | Freq: Once | INTRAVENOUS | Status: AC | PRN
  Administered 2017-01-03: 31.8 via INTRAVENOUS
  Filled 2017-01-03: qty 32

## 2017-01-03 MED ORDER — TECHNETIUM TC 99M TETROFOSMIN IV KIT
11.0000 | PACK | Freq: Once | INTRAVENOUS | Status: AC | PRN
  Administered 2017-01-03: 11 via INTRAVENOUS
  Filled 2017-01-03: qty 11

## 2017-01-03 MED ORDER — REGADENOSON 0.4 MG/5ML IV SOLN
0.4000 mg | Freq: Once | INTRAVENOUS | Status: AC
  Administered 2017-01-03: 0.4 mg via INTRAVENOUS

## 2017-01-15 DIAGNOSIS — I10 Essential (primary) hypertension: Secondary | ICD-10-CM | POA: Diagnosis not present

## 2017-01-15 DIAGNOSIS — Z8679 Personal history of other diseases of the circulatory system: Secondary | ICD-10-CM

## 2017-01-15 DIAGNOSIS — E875 Hyperkalemia: Secondary | ICD-10-CM

## 2017-01-16 ENCOUNTER — Encounter: Payer: Self-pay | Admitting: *Deleted

## 2017-01-16 DIAGNOSIS — I10 Essential (primary) hypertension: Secondary | ICD-10-CM | POA: Diagnosis not present

## 2017-01-16 DIAGNOSIS — E875 Hyperkalemia: Secondary | ICD-10-CM | POA: Diagnosis not present

## 2017-01-16 DIAGNOSIS — Z8679 Personal history of other diseases of the circulatory system: Secondary | ICD-10-CM | POA: Diagnosis not present

## 2017-02-26 ENCOUNTER — Ambulatory Visit: Payer: Self-pay | Admitting: Internal Medicine

## 2017-05-23 ENCOUNTER — Encounter: Payer: Self-pay | Admitting: *Deleted

## 2017-05-23 ENCOUNTER — Ambulatory Visit: Payer: Self-pay | Admitting: Internal Medicine

## 2017-07-03 DIAGNOSIS — Z96651 Presence of right artificial knee joint: Secondary | ICD-10-CM

## 2017-07-03 DIAGNOSIS — M25561 Pain in right knee: Secondary | ICD-10-CM | POA: Insufficient documentation

## 2017-07-03 DIAGNOSIS — G8929 Other chronic pain: Secondary | ICD-10-CM

## 2017-07-03 HISTORY — DX: Presence of right artificial knee joint: Z96.651

## 2017-07-03 HISTORY — DX: Other chronic pain: G89.29

## 2017-07-13 ENCOUNTER — Other Ambulatory Visit: Payer: Self-pay | Admitting: Orthopedic Surgery

## 2017-07-14 ENCOUNTER — Other Ambulatory Visit (HOSPITAL_COMMUNITY): Payer: Self-pay | Admitting: *Deleted

## 2017-07-14 NOTE — Pre-Procedure Instructions (Addendum)
Riley Grant  07/14/2017     Your procedure is scheduled on Friday, July 18, 2017 at 7:15 AM.   Report to [facility] Entrance "A" Admitting Office at 5:30 AM.   Call this number if you have problems the morning of surgery: [phone]   Questions prior to day of surgery, please call [phone] between 8 & 4 PM.   Remember:  Do not eat food or drink liquids after midnight Thursday, 07/17/17.  Take these medicines the morning of surgery with A SIP OF WATER: Amlodipine (Norvasc), Buspirone (Buspar), Hydrocodone - prn  Stop Aspirin, NSAIDS (Ibuprofen, Aleve, etc) and Hemp Oil prior to surgery.   Do not wear jewelry.  Do not wear lotions, powders, cologne or deodorant.  Men may shave face and neck.  Do not bring valuables to the hospital.  [facility] is not responsible for any belongings or valuables.  Contacts, dentures or bridgework may not be worn into surgery.  Leave your suitcase in the car.  After surgery it may be brought to your room.  For patients admitted to the hospital, discharge time will be determined by your treatment team.  [facility] - Preparing for Surgery  Before surgery, you can play an important role.  Because skin is not sterile, your skin needs to be as free of germs as possible.  You can reduce the number of germs on you skin by washing with CHG (chlorahexidine gluconate) soap before surgery.  CHG is an antiseptic cleaner which kills germs and bonds with the skin to continue killing germs even after washing.  Please DO NOT use if you have an allergy to CHG or antibacterial soaps.  If your skin becomes reddened/irritated stop using the CHG and inform your nurse when you arrive at Short Stay.  Do not shave (including legs and underarms) for at least 48 hours prior to the first CHG shower.  You may shave your face.  Please follow these instructions carefully:   1.  Shower with CHG Soap the night before surgery and the                    morning  of Surgery.  2.  If you choose to wash your hair, wash your hair first as usual with your       normal shampoo.  3.  After you shampoo, rinse your hair and body thoroughly to remove the shampoo.  4.  Use CHG as you would any other liquid soap.  You can apply chg directly       to the skin and wash gently with scrungie or a clean washcloth.  5.  Apply the CHG Soap to your body ONLY FROM THE NECK DOWN.        Do not use on open wounds or open sores.  Avoid contact with your eyes, ears, mouth and genitals (private parts).  Wash genitals (private parts) with your normal soap.  6.  Wash thoroughly, paying special attention to the area where your surgery        will be performed.  7.  Thoroughly rinse your body with warm water from the neck down.  8.  DO NOT shower/wash with your normal soap after using and rinsing off       the CHG Soap.  9.  Pat yourself dry with a clean towel.            10.  Wear clean pajamas.  11.  Place clean sheets on your bed the night of your first shower and do not        sleep with pets.  Day of Surgery  Do not apply any lotions/deodorants the morning of surgery.  Please wear clean clothes to the hospital.   Please read over the fact sheets that you were given.

## 2017-07-15 ENCOUNTER — Encounter (HOSPITAL_COMMUNITY)
Admission: RE | Admit: 2017-07-15 | Discharge: 2017-07-15 | Disposition: A | Discharge status: Home / Self Care | Payer: Self-pay | Source: Ambulatory Visit | Attending: Orthopedic Surgery | Admitting: Orthopedic Surgery

## 2017-07-15 ENCOUNTER — Encounter (HOSPITAL_COMMUNITY): Payer: Self-pay

## 2017-07-15 DIAGNOSIS — Z8673 Personal history of transient ischemic attack (TIA), and cerebral infarction without residual deficits: Secondary | ICD-10-CM | POA: Insufficient documentation

## 2017-07-15 DIAGNOSIS — Z87828 Personal history of other (healed) physical injury and trauma: Secondary | ICD-10-CM | POA: Insufficient documentation

## 2017-07-15 DIAGNOSIS — I10 Essential (primary) hypertension: Secondary | ICD-10-CM | POA: Insufficient documentation

## 2017-07-15 DIAGNOSIS — Z01810 Encounter for preprocedural cardiovascular examination: Secondary | ICD-10-CM | POA: Insufficient documentation

## 2017-07-15 DIAGNOSIS — I252 Old myocardial infarction: Secondary | ICD-10-CM | POA: Insufficient documentation

## 2017-07-15 DIAGNOSIS — Z9049 Acquired absence of other specified parts of digestive tract: Secondary | ICD-10-CM | POA: Insufficient documentation

## 2017-07-15 DIAGNOSIS — I2510 Atherosclerotic heart disease of native coronary artery without angina pectoris: Secondary | ICD-10-CM | POA: Insufficient documentation

## 2017-07-15 DIAGNOSIS — Z9889 Other specified postprocedural states: Secondary | ICD-10-CM | POA: Insufficient documentation

## 2017-07-15 DIAGNOSIS — Z01812 Encounter for preprocedural laboratory examination: Secondary | ICD-10-CM | POA: Insufficient documentation

## 2017-07-15 DIAGNOSIS — K219 Gastro-esophageal reflux disease without esophagitis: Secondary | ICD-10-CM | POA: Insufficient documentation

## 2017-07-15 HISTORY — DX: Cerebral infarction, unspecified: I63.9

## 2017-07-15 HISTORY — DX: Sleep apnea, unspecified: G47.30

## 2017-07-15 HISTORY — DX: Unspecified osteoarthritis, unspecified site: M19.90

## 2017-07-15 HISTORY — DX: Gastro-esophageal reflux disease without esophagitis: K21.9

## 2017-07-15 HISTORY — DX: Burn of unspecified body region, unspecified degree: T30.0

## 2017-07-15 HISTORY — DX: Family history of other specified conditions: Z84.89

## 2017-07-15 HISTORY — DX: Atherosclerotic heart disease of native coronary artery without angina pectoris: I25.10

## 2017-07-15 HISTORY — DX: Other complications of anesthesia, initial encounter: T88.59XA

## 2017-07-15 HISTORY — DX: Adverse effect of unspecified anesthetic, initial encounter: T41.45XA

## 2017-07-15 HISTORY — DX: Electrocution, initial encounter: T75.4XXA

## 2017-07-15 LAB — TYPE AND SCREEN
ABO/RH(D): A POS
ANTIBODY SCREEN: NEGATIVE

## 2017-07-15 LAB — CBC WITH DIFFERENTIAL/PLATELET
Basophils Absolute: 0 10*3/uL (ref 0.0–0.1)
Basophils Relative: 0 %
EOS ABS: 0.1 10*3/uL (ref 0.0–0.7)
EOS PCT: 2 %
HCT: 45.6 % (ref 39.0–52.0)
HEMOGLOBIN: 16 g/dL (ref 13.0–17.0)
LYMPHS ABS: 2.6 10*3/uL (ref 0.7–4.0)
Lymphocytes Relative: 39 %
MCH: 28.8 pg (ref 26.0–34.0)
MCHC: 35.1 g/dL (ref 30.0–36.0)
MCV: 82.2 fL (ref 78.0–100.0)
MONOS PCT: 9 %
Monocytes Absolute: 0.6 10*3/uL (ref 0.1–1.0)
Neutro Abs: 3.3 10*3/uL (ref 1.7–7.7)
Neutrophils Relative %: 50 %
PLATELETS: 264 10*3/uL (ref 150–400)
RBC: 5.55 MIL/uL (ref 4.22–5.81)
RDW: 13.5 % (ref 11.5–15.5)
WBC: 6.6 10*3/uL (ref 4.0–10.5)

## 2017-07-15 LAB — COMPREHENSIVE METABOLIC PANEL
ALK PHOS: 74 U/L (ref 38–126)
ALT: 89 U/L — ABNORMAL HIGH (ref 17–63)
ANION GAP: 9 (ref 5–15)
AST: 57 U/L — ABNORMAL HIGH (ref 15–41)
Albumin: 4.1 g/dL (ref 3.5–5.0)
BUN: 11 mg/dL (ref 6–20)
CALCIUM: 9.3 mg/dL (ref 8.9–10.3)
CO2: 25 mmol/L (ref 22–32)
Chloride: 104 mmol/L (ref 101–111)
Creatinine, Ser: 1 mg/dL (ref 0.61–1.24)
Glucose, Bld: 149 mg/dL — ABNORMAL HIGH (ref 65–99)
Potassium: 4.1 mmol/L (ref 3.5–5.1)
Sodium: 138 mmol/L (ref 135–145)
Total Bilirubin: 1.6 mg/dL — ABNORMAL HIGH (ref 0.3–1.2)
Total Protein: 7.6 g/dL (ref 6.5–8.1)

## 2017-07-15 LAB — SURGICAL PCR SCREEN
MRSA, PCR: NEGATIVE
Staphylococcus aureus: POSITIVE — AB

## 2017-07-15 LAB — ABO/RH: ABO/RH(D): A POS
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
## 2017-07-17 MED ORDER — CEFAZOLIN SODIUM-DEXTROSE 2-4 GM/100ML-% IV SOLN
2.0000 g | INTRAVENOUS | Status: AC
  Administered 2017-07-18: 2 g via INTRAVENOUS
  Filled 2017-07-17: qty 100

## 2017-07-17 MED ORDER — SODIUM CHLORIDE 0.9 % IV SOLN
1000.0000 mg | INTRAVENOUS | Status: AC
  Administered 2017-07-18: 1000 mg via INTRAVENOUS
  Filled 2017-07-17: qty 10
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
## 2017-07-17 NOTE — Anesthesia Preprocedure Evaluation (Addendum)
Anesthesia Evaluation  Patient identified by MRN, date of birth, ID band Patient awake    Reviewed: Allergy & Precautions, NPO status , Patient's Chart, lab work & pertinent test results  Airway Mallampati: III  TM Distance: >3 FB Neck ROM: Full    Dental no notable dental hx.    Pulmonary neg pulmonary ROS,    Pulmonary exam normal breath sounds clear to auscultation       Cardiovascular hypertension, Pt. on medications + CAD and + Past MI  negative cardio ROS Normal cardiovascular exam Rhythm:Regular Rate:Normal     Neuro/Psych negative neurological ROS  negative psych ROS   GI/Hepatic negative GI ROS, Neg liver ROS, GERD  Medicated,  Endo/Other  negative endocrine ROS  Renal/GU negative Renal ROS  negative genitourinary   Musculoskeletal negative musculoskeletal ROS (+) Arthritis , Osteoarthritis,    Abdominal   Peds negative pediatric ROS (+)  Hematology negative hematology ROS (+)   Anesthesia Other Findings 7/18 ekg  Normal sinus rhythm Normal ECG  Nuclear stress test 01/03/17:  Nuclear stress EF: 54%.  The left ventricular ejection fraction is mildly decreased (45-54%).  The study is normal.  This is a low risk study.  There was no ST segment deviation noted during stress. Normal resting and stress perfusion. No ischemia or infarction EF 54%  Reproductive/Obstetrics negative OB ROS                           Anesthesia Physical Anesthesia Plan  ASA: III  Anesthesia Plan: General   Post-op Pain Management:  Regional for Post-op pain   Induction:   PONV Risk Score and Plan: 2 and Ondansetron, Dexamethasone and Treatment may vary due to age or medical condition  Airway Management Planned: LMA  Additional Equipment:   Intra-op Plan:   Post-operative Plan:   Informed Consent: I have reviewed the patients History and Physical, chart, labs and discussed the  procedure including the risks, benefits and alternatives for the proposed anesthesia with the patient or authorized representative who has indicated his/her understanding and acceptance.   Dental advisory given  Plan Discussed with:   Anesthesia Plan Comments: (  )      Anesthesia Quick Evaluation

## 2017-07-18 ENCOUNTER — Ambulatory Visit (HOSPITAL_COMMUNITY): Payer: Self-pay | Admitting: Certified Registered"

## 2017-07-18 ENCOUNTER — Observation Stay (HOSPITAL_COMMUNITY)
Admission: RE | Admit: 2017-07-18 | Discharge: 2017-07-19 | Disposition: A | Discharge status: Home / Self Care | Payer: Self-pay | Source: Ambulatory Visit | Attending: Orthopedic Surgery | Admitting: Orthopedic Surgery

## 2017-07-18 ENCOUNTER — Encounter (HOSPITAL_COMMUNITY)
Admission: RE | Disposition: A | Discharge status: Home / Self Care | Payer: Self-pay | Source: Ambulatory Visit | Attending: Orthopedic Surgery

## 2017-07-18 ENCOUNTER — Encounter (HOSPITAL_COMMUNITY): Payer: Self-pay | Admitting: Orthopedic Surgery

## 2017-07-18 ENCOUNTER — Ambulatory Visit (HOSPITAL_COMMUNITY): Payer: Self-pay | Admitting: Vascular Surgery

## 2017-07-18 DIAGNOSIS — Z955 Presence of coronary angioplasty implant and graft: Secondary | ICD-10-CM | POA: Insufficient documentation

## 2017-07-18 DIAGNOSIS — Z8673 Personal history of transient ischemic attack (TIA), and cerebral infarction without residual deficits: Secondary | ICD-10-CM | POA: Insufficient documentation

## 2017-07-18 DIAGNOSIS — M1990 Unspecified osteoarthritis, unspecified site: Secondary | ICD-10-CM | POA: Insufficient documentation

## 2017-07-18 DIAGNOSIS — Y792 Prosthetic and other implants, materials and accessory orthopedic devices associated with adverse incidents: Secondary | ICD-10-CM | POA: Insufficient documentation

## 2017-07-18 DIAGNOSIS — Z96659 Presence of unspecified artificial knee joint: Secondary | ICD-10-CM

## 2017-07-18 DIAGNOSIS — I10 Essential (primary) hypertension: Secondary | ICD-10-CM | POA: Insufficient documentation

## 2017-07-18 DIAGNOSIS — I2510 Atherosclerotic heart disease of native coronary artery without angina pectoris: Secondary | ICD-10-CM | POA: Insufficient documentation

## 2017-07-18 DIAGNOSIS — T84099A Other mechanical complication of unspecified internal joint prosthesis, initial encounter: Principal | ICD-10-CM | POA: Insufficient documentation

## 2017-07-18 DIAGNOSIS — Z96651 Presence of right artificial knee joint: Secondary | ICD-10-CM

## 2017-07-18 DIAGNOSIS — I252 Old myocardial infarction: Secondary | ICD-10-CM | POA: Insufficient documentation

## 2017-07-18 DIAGNOSIS — F329 Major depressive disorder, single episode, unspecified: Secondary | ICD-10-CM | POA: Insufficient documentation

## 2017-07-18 DIAGNOSIS — F419 Anxiety disorder, unspecified: Secondary | ICD-10-CM | POA: Insufficient documentation

## 2017-07-18 HISTORY — DX: Bipolar disorder, unspecified: F31.9

## 2017-07-18 HISTORY — DX: Claustrophobia: F40.240

## 2017-07-18 HISTORY — DX: Migraine, unspecified, not intractable, without status migrainosus: G43.909

## 2017-07-18 HISTORY — DX: Depression, unspecified: F32.A

## 2017-07-18 HISTORY — DX: Transient cerebral ischemic attack, unspecified: G45.9

## 2017-07-18 HISTORY — PX: TOTAL KNEE REVISION WITH SCAR DEBRIDEMENT/PATELLA REVISION WITH POLY EXCHANGE: SHX6128

## 2017-07-18 HISTORY — PX: SYNOVECTOMY WITH POLY EXCHANGE: SHX6746

## 2017-07-18 HISTORY — DX: Solitary pulmonary nodule: R91.1

## 2017-07-18 HISTORY — DX: Pure hypercholesterolemia, unspecified: E78.00

## 2017-07-18 HISTORY — DX: Major depressive disorder, single episode, unspecified: F32.9

## 2017-07-18 HISTORY — DX: Angina pectoris, unspecified: I20.9

## 2017-07-18 HISTORY — DX: Anxiety disorder, unspecified: F41.9

## 2017-07-18 SURGERY — SYNOVECTOMY WITH POLY EXCHANGE
Anesthesia: General | Site: Knee | Laterality: Right

## 2017-07-18 MED ORDER — DOCUSATE SODIUM 100 MG PO CAPS
100.0000 mg | ORAL_CAPSULE | Freq: Two times a day (BID) | ORAL | Status: DC
  Administered 2017-07-18 – 2017-07-19 (×2): 100 mg via ORAL
  Filled 2017-07-18 (×3): qty 1

## 2017-07-18 MED ORDER — SODIUM CHLORIDE 0.9 % IJ SOLN
INTRAMUSCULAR | Status: DC | PRN
  Administered 2017-07-18 (×2): 10 mL

## 2017-07-18 MED ORDER — HYDROCODONE-ACETAMINOPHEN 7.5-325 MG PO TABS
1.0000 | ORAL_TABLET | Freq: Four times a day (QID) | ORAL | Status: DC
  Administered 2017-07-18 – 2017-07-19 (×3): 1 via ORAL
  Filled 2017-07-18 (×3): qty 1

## 2017-07-18 MED ORDER — LACTATED RINGERS IV SOLN
INTRAVENOUS | Status: DC | PRN
  Administered 2017-07-18 (×2): via INTRAVENOUS

## 2017-07-18 MED ORDER — DIVALPROEX SODIUM ER 500 MG PO TB24
500.0000 mg | ORAL_TABLET | Freq: Every day | ORAL | Status: DC
  Administered 2017-07-18: 500 mg via ORAL
  Filled 2017-07-18: qty 1

## 2017-07-18 MED ORDER — ACETAMINOPHEN 650 MG RE SUPP: 650.0000 mg | Freq: Four times a day (QID) | RECTAL | Status: DC | PRN

## 2017-07-18 MED ORDER — METOCLOPRAMIDE HCL 5 MG/ML IJ SOLN: 5.0000 mg | Freq: Three times a day (TID) | INTRAMUSCULAR | Status: DC | PRN

## 2017-07-18 MED ORDER — CELECOXIB 200 MG PO CAPS
200.0000 mg | ORAL_CAPSULE | Freq: Two times a day (BID) | ORAL | Status: DC
  Administered 2017-07-18 – 2017-07-19 (×2): 200 mg via ORAL
  Filled 2017-07-18 (×2): qty 1

## 2017-07-18 MED ORDER — MIDAZOLAM HCL 5 MG/5ML IJ SOLN
INTRAMUSCULAR | Status: DC | PRN
  Administered 2017-07-18: 2 mg via INTRAVENOUS

## 2017-07-18 MED ORDER — BUPIVACAINE-EPINEPHRINE (PF) 0.25% -1:200000 IJ SOLN
INTRAMUSCULAR | Status: AC
  Filled 2017-07-18: qty 30

## 2017-07-18 MED ORDER — OXYCODONE HCL 10 MG PO TABS
10.0000 mg | ORAL_TABLET | ORAL | 0 refills | Status: DC | PRN
Start: 2017-07-18 — End: 2018-12-03

## 2017-07-18 MED ORDER — ACETAMINOPHEN 500 MG PO TABS
1000.0000 mg | ORAL_TABLET | Freq: Once | ORAL | Status: AC
  Administered 2017-07-18: 1000 mg via ORAL

## 2017-07-18 MED ORDER — ONDANSETRON HCL 4 MG/2ML IJ SOLN: 4.0000 mg | Freq: Once | INTRAMUSCULAR | Status: DC | PRN

## 2017-07-18 MED ORDER — ONDANSETRON HCL 4 MG/2ML IJ SOLN: 4.0000 mg | Freq: Four times a day (QID) | INTRAMUSCULAR | Status: DC | PRN

## 2017-07-18 MED ORDER — ALUM & MAG HYDROXIDE-SIMETH 200-200-20 MG/5ML PO SUSP: 30.0000 mL | ORAL | Status: DC | PRN

## 2017-07-18 MED ORDER — DEXAMETHASONE SODIUM PHOSPHATE 10 MG/ML IJ SOLN
10.0000 mg | Freq: Once | INTRAMUSCULAR | Status: AC
  Administered 2017-07-19: 10 mg via INTRAVENOUS
  Filled 2017-07-18: qty 1

## 2017-07-18 MED ORDER — SODIUM CHLORIDE 0.9 % IR SOLN
Status: DC | PRN
  Administered 2017-07-18: 3000 mL

## 2017-07-18 MED ORDER — PROPOFOL 10 MG/ML IV BOLUS
INTRAVENOUS | Status: AC
  Filled 2017-07-18: qty 20

## 2017-07-18 MED ORDER — BUPIVACAINE-EPINEPHRINE 0.5% -1:200000 IJ SOLN
INTRAMUSCULAR | Status: DC | PRN
  Administered 2017-07-18: 20 mL

## 2017-07-18 MED ORDER — BUSPIRONE HCL 5 MG PO TABS
5.0000 mg | ORAL_TABLET | Freq: Two times a day (BID) | ORAL | Status: DC
  Administered 2017-07-18 – 2017-07-19 (×2): 5 mg via ORAL
  Filled 2017-07-18 (×2): qty 1

## 2017-07-18 MED ORDER — MEPERIDINE HCL 25 MG/ML IJ SOLN: 6.2500 mg | INTRAMUSCULAR | Status: DC | PRN

## 2017-07-18 MED ORDER — FENTANYL CITRATE (PF) 100 MCG/2ML IJ SOLN
INTRAMUSCULAR | Status: AC
Start: 2017-07-18 — End: 2017-07-18
  Filled 2017-07-18: qty 2

## 2017-07-18 MED ORDER — CHLORHEXIDINE GLUCONATE 4 % EX LIQD: 60.0000 mL | Freq: Once | CUTANEOUS | Status: DC

## 2017-07-18 MED ORDER — DIPHENHYDRAMINE HCL 12.5 MG/5ML PO ELIX
12.5000 mg | ORAL_SOLUTION | ORAL | Status: DC | PRN
  Administered 2017-07-19: 25 mg via ORAL
  Filled 2017-07-18: qty 10

## 2017-07-18 MED ORDER — CELECOXIB 200 MG PO CAPS: 200.0000 mg | ORAL_CAPSULE | Freq: Two times a day (BID) | ORAL | Status: DC

## 2017-07-18 MED ORDER — MENTHOL 3 MG MT LOZG: 1.0000 | LOZENGE | OROMUCOSAL | Status: DC | PRN

## 2017-07-18 MED ORDER — GABAPENTIN 300 MG PO CAPS
300.0000 mg | ORAL_CAPSULE | Freq: Once | ORAL | Status: AC
  Administered 2017-07-18: 300 mg via ORAL

## 2017-07-18 MED ORDER — CEFAZOLIN SODIUM-DEXTROSE 1-4 GM/50ML-% IV SOLN
1.0000 g | Freq: Four times a day (QID) | INTRAVENOUS | Status: AC
  Administered 2017-07-18 (×2): 1 g via INTRAVENOUS
  Filled 2017-07-18 (×2): qty 50

## 2017-07-18 MED ORDER — ZOLPIDEM TARTRATE 5 MG PO TABS: 5.0000 mg | ORAL_TABLET | Freq: Every evening | ORAL | Status: DC | PRN

## 2017-07-18 MED ORDER — TRANEXAMIC ACID 1000 MG/10ML IV SOLN
1000.0000 mg | Freq: Once | INTRAVENOUS | Status: AC
  Administered 2017-07-18: 1000 mg via INTRAVENOUS
  Filled 2017-07-18: qty 10

## 2017-07-18 MED ORDER — FENTANYL CITRATE (PF) 100 MCG/2ML IJ SOLN
25.0000 ug | INTRAMUSCULAR | Status: DC | PRN
  Administered 2017-07-18 (×3): 50 ug via INTRAVENOUS

## 2017-07-18 MED ORDER — ONDANSETRON HCL 4 MG/2ML IJ SOLN
INTRAMUSCULAR | Status: DC | PRN
  Administered 2017-07-18: 4 mg via INTRAVENOUS

## 2017-07-18 MED ORDER — METHOCARBAMOL 1000 MG/10ML IJ SOLN
500.0000 mg | Freq: Four times a day (QID) | INTRAVENOUS | Status: DC | PRN
  Filled 2017-07-18: qty 5

## 2017-07-18 MED ORDER — DEXAMETHASONE SODIUM PHOSPHATE 10 MG/ML IJ SOLN
8.0000 mg | Freq: Once | INTRAMUSCULAR | Status: AC
  Administered 2017-07-18: 8 mg via INTRAVENOUS

## 2017-07-18 MED ORDER — PHENOL 1.4 % MT LIQD: 1.0000 | OROMUCOSAL | Status: DC | PRN

## 2017-07-18 MED ORDER — HYDROMORPHONE HCL 1 MG/ML IJ SOLN
1.0000 mg | INTRAMUSCULAR | Status: DC | PRN
  Administered 2017-07-18 – 2017-07-19 (×5): 1 mg via INTRAVENOUS
  Filled 2017-07-18 (×3): qty 1

## 2017-07-18 MED ORDER — HYDROMORPHONE HCL 1 MG/ML IJ SOLN
INTRAMUSCULAR | Status: AC
  Filled 2017-07-18: qty 1

## 2017-07-18 MED ORDER — ACETAMINOPHEN 325 MG PO TABS: 650.0000 mg | ORAL_TABLET | Freq: Four times a day (QID) | ORAL | Status: DC | PRN

## 2017-07-18 MED ORDER — BUPIVACAINE LIPOSOME 1.3 % IJ SUSP
INTRAMUSCULAR | Status: DC | PRN
  Administered 2017-07-18: 20 mL

## 2017-07-18 MED ORDER — TOBRAMYCIN SULFATE 1.2 G IJ SOLR
INTRAMUSCULAR | Status: AC
  Filled 2017-07-18: qty 1.2

## 2017-07-18 MED ORDER — ACETAMINOPHEN 500 MG PO TABS
ORAL_TABLET | ORAL | Status: AC
  Filled 2017-07-18: qty 2

## 2017-07-18 MED ORDER — FLEET ENEMA 7-19 GM/118ML RE ENEM: 1.0000 | ENEMA | Freq: Once | RECTAL | Status: DC | PRN

## 2017-07-18 MED ORDER — SENNOSIDES-DOCUSATE SODIUM 8.6-50 MG PO TABS
1.0000 | ORAL_TABLET | Freq: Every evening | ORAL | Status: DC | PRN
  Administered 2017-07-18: 1 via ORAL
  Filled 2017-07-18: qty 1

## 2017-07-18 MED ORDER — PROPOFOL 10 MG/ML IV BOLUS
INTRAVENOUS | Status: DC | PRN
  Administered 2017-07-18: 200 mg via INTRAVENOUS

## 2017-07-18 MED ORDER — METOCLOPRAMIDE HCL 5 MG PO TABS: 5.0000 mg | ORAL_TABLET | Freq: Three times a day (TID) | ORAL | Status: DC | PRN

## 2017-07-18 MED ORDER — OXYCODONE HCL 5 MG PO TABS
5.0000 mg | ORAL_TABLET | ORAL | Status: DC | PRN
  Administered 2017-07-18 – 2017-07-19 (×3): 10 mg via ORAL
  Filled 2017-07-18 (×3): qty 2

## 2017-07-18 MED ORDER — GABAPENTIN 300 MG PO CAPS
300.0000 mg | ORAL_CAPSULE | Freq: Three times a day (TID) | ORAL | Status: DC
  Administered 2017-07-18 – 2017-07-19 (×2): 300 mg via ORAL
  Filled 2017-07-18 (×2): qty 1

## 2017-07-18 MED ORDER — METHOCARBAMOL 500 MG PO TABS: 500.0000 mg | ORAL_TABLET | Freq: Four times a day (QID) | ORAL | 0 refills | Status: DC | PRN

## 2017-07-18 MED ORDER — ONDANSETRON HCL 4 MG PO TABS: 4.0000 mg | ORAL_TABLET | Freq: Four times a day (QID) | ORAL | Status: DC | PRN

## 2017-07-18 MED ORDER — FENTANYL CITRATE (PF) 100 MCG/2ML IJ SOLN
INTRAMUSCULAR | Status: AC
  Filled 2017-07-18: qty 2

## 2017-07-18 MED ORDER — ASPIRIN EC 325 MG PO TBEC
325.0000 mg | DELAYED_RELEASE_TABLET | Freq: Two times a day (BID) | ORAL | Status: DC
  Administered 2017-07-18 – 2017-07-19 (×3): 325 mg via ORAL
  Filled 2017-07-18 (×3): qty 1

## 2017-07-18 MED ORDER — MIDAZOLAM HCL 2 MG/2ML IJ SOLN
INTRAMUSCULAR | Status: AC
  Filled 2017-07-18: qty 2

## 2017-07-18 MED ORDER — METHOCARBAMOL 500 MG PO TABS
500.0000 mg | ORAL_TABLET | Freq: Four times a day (QID) | ORAL | Status: DC | PRN
  Administered 2017-07-18 – 2017-07-19 (×2): 500 mg via ORAL
  Filled 2017-07-18 (×3): qty 1

## 2017-07-18 MED ORDER — PHENYLEPHRINE 40 MCG/ML (10ML) SYRINGE FOR IV PUSH (FOR BLOOD PRESSURE SUPPORT)
PREFILLED_SYRINGE | INTRAVENOUS | Status: AC
  Filled 2017-07-18: qty 10

## 2017-07-18 MED ORDER — ONDANSETRON HCL 4 MG/2ML IJ SOLN
INTRAMUSCULAR | Status: AC
  Filled 2017-07-18: qty 2

## 2017-07-18 MED ORDER — AMLODIPINE BESYLATE 10 MG PO TABS
10.0000 mg | ORAL_TABLET | Freq: Every day | ORAL | Status: DC
  Administered 2017-07-18 – 2017-07-19 (×2): 10 mg via ORAL
  Filled 2017-07-18 (×2): qty 1

## 2017-07-18 MED ORDER — GABAPENTIN 300 MG PO CAPS
ORAL_CAPSULE | ORAL | Status: AC
  Filled 2017-07-18: qty 1

## 2017-07-18 MED ORDER — BISACODYL 5 MG PO TBEC: 5.0000 mg | DELAYED_RELEASE_TABLET | Freq: Every day | ORAL | Status: DC | PRN

## 2017-07-18 MED ORDER — PANTOPRAZOLE SODIUM 40 MG PO TBEC: 40.0000 mg | DELAYED_RELEASE_TABLET | Freq: Every day | ORAL | Status: DC

## 2017-07-18 MED ORDER — DEXAMETHASONE SODIUM PHOSPHATE 10 MG/ML IJ SOLN
INTRAMUSCULAR | Status: AC
  Filled 2017-07-18: qty 1

## 2017-07-18 MED ORDER — BUPIVACAINE-EPINEPHRINE (PF) 0.5% -1:200000 IJ SOLN
INTRAMUSCULAR | Status: AC
  Filled 2017-07-18: qty 30

## 2017-07-18 MED ORDER — THROMBIN 20000 UNITS EX KIT
PACK | CUTANEOUS | Status: AC
  Filled 2017-07-18: qty 1

## 2017-07-18 MED ORDER — BUPIVACAINE-EPINEPHRINE (PF) 0.5% -1:200000 IJ SOLN
INTRAMUSCULAR | Status: DC | PRN
  Administered 2017-07-18: 30 mL via PERINEURAL

## 2017-07-18 MED ORDER — FENTANYL CITRATE (PF) 250 MCG/5ML IJ SOLN
INTRAMUSCULAR | Status: DC | PRN
  Administered 2017-07-18 (×3): 50 ug via INTRAVENOUS
  Administered 2017-07-18: 100 ug via INTRAVENOUS

## 2017-07-18 MED ORDER — 0.9 % SODIUM CHLORIDE (POUR BTL) OPTIME
TOPICAL | Status: DC | PRN
  Administered 2017-07-18: 1000 mL

## 2017-07-18 MED ORDER — PHENYLEPHRINE 40 MCG/ML (10ML) SYRINGE FOR IV PUSH (FOR BLOOD PRESSURE SUPPORT)
PREFILLED_SYRINGE | INTRAVENOUS | Status: DC | PRN
  Administered 2017-07-18 (×3): 80 ug via INTRAVENOUS

## 2017-07-18 MED ORDER — ASPIRIN 325 MG PO TBEC: 325.0000 mg | DELAYED_RELEASE_TABLET | Freq: Two times a day (BID) | ORAL | 0 refills | Status: DC

## 2017-07-18 MED ORDER — BUPIVACAINE LIPOSOME 1.3 % IJ SUSP
20.0000 mL | INTRAMUSCULAR | Status: DC
  Filled 2017-07-18: qty 20

## 2017-07-18 MED ORDER — FENTANYL CITRATE (PF) 250 MCG/5ML IJ SOLN
INTRAMUSCULAR | Status: AC
  Filled 2017-07-18: qty 5

## 2017-07-18 SURGICAL SUPPLY — 75 items
BANDAGE ESMARK 6X9 LF (GAUZE/BANDAGES/DRESSINGS) ×1 IMPLANT
BLADE SAGITTAL 13X1.27X60 (BLADE) ×2 IMPLANT
BLADE SAGITTAL 13X1.27X60MM (BLADE) ×1
BLADE SAW SGTL 13X75X1.27 (BLADE) IMPLANT
BLADE SAW SGTL 83.5X18.5 (BLADE) IMPLANT
BLADE SAW SGTL NAR THIN XSHT (BLADE) IMPLANT
BLADE SURG 10 STRL SS (BLADE) ×12 IMPLANT
BNDG ESMARK 6X9 LF (GAUZE/BANDAGES/DRESSINGS) ×3
BOWL SMART MIX CTS (DISPOSABLE) IMPLANT
COVER SURGICAL LIGHT HANDLE (MISCELLANEOUS) ×3 IMPLANT
CUFF TOURNIQUET SINGLE 34IN LL (TOURNIQUET CUFF) ×3 IMPLANT
DRAPE EXTREMITY T 121X128X90 (DRAPE) ×3 IMPLANT
DRAPE HALF SHEET 40X57 (DRAPES) IMPLANT
DRAPE IMP U-DRAPE 54X76 (DRAPES) ×3 IMPLANT
DRAPE INCISE IOBAN 66X45 STRL (DRAPES) ×3 IMPLANT
DRAPE U-SHAPE 47X51 STRL (DRAPES) ×3 IMPLANT
DRSG ADAPTIC 3X8 NADH LF (GAUZE/BANDAGES/DRESSINGS) ×3 IMPLANT
DRSG AQUACEL AG ADV 3.5X10 (GAUZE/BANDAGES/DRESSINGS) ×3 IMPLANT
DRSG PAD ABDOMINAL 8X10 ST (GAUZE/BANDAGES/DRESSINGS) ×3 IMPLANT
DURAPREP 26ML APPLICATOR (WOUND CARE) ×3 IMPLANT
ELECT REM PT RETURN 9FT ADLT (ELECTROSURGICAL) ×3
ELECTRODE REM PT RTRN 9FT ADLT (ELECTROSURGICAL) ×1 IMPLANT
EVACUATOR 1/8 PVC DRAIN (DRAIN) IMPLANT
FACESHIELD WRAPAROUND (MASK) IMPLANT
FLUID NSS /IRRIG 3000 ML XXX (IV SOLUTION) ×3 IMPLANT
GAUZE SPONGE 4X4 12PLY STRL (GAUZE/BANDAGES/DRESSINGS) ×3 IMPLANT
GLOVE BIOGEL M 7.0 STRL (GLOVE) IMPLANT
GLOVE BIOGEL PI IND STRL 7.5 (GLOVE) IMPLANT
GLOVE BIOGEL PI IND STRL 8.5 (GLOVE) ×4 IMPLANT
GLOVE BIOGEL PI INDICATOR 7.5 (GLOVE)
GLOVE BIOGEL PI INDICATOR 8.5 (GLOVE) ×8
GLOVE BIOGEL PI ORTHO PRO SZ8 (GLOVE) ×2
GLOVE PI ORTHO PRO STRL SZ8 (GLOVE) ×1 IMPLANT
GLOVE SURG ORTHO 8.0 STRL STRW (GLOVE) ×18 IMPLANT
GOWN STRL REUS W/ TWL LRG LVL3 (GOWN DISPOSABLE) ×1 IMPLANT
GOWN STRL REUS W/ TWL XL LVL3 (GOWN DISPOSABLE) ×2 IMPLANT
GOWN STRL REUS W/TWL 2XL LVL3 (GOWN DISPOSABLE) ×3 IMPLANT
GOWN STRL REUS W/TWL LRG LVL3 (GOWN DISPOSABLE) ×3
GOWN STRL REUS W/TWL XL LVL3 (GOWN DISPOSABLE) ×6
HANDPIECE INTERPULSE COAX TIP (DISPOSABLE) ×3
HOOD PEEL AWAY FACE SHEILD DIS (HOOD) ×9 IMPLANT
INSERT TIBIAL BEAR SZ4 16MMX3 (Insert) ×3 IMPLANT
KIT BASIN OR (CUSTOM PROCEDURE TRAY) ×3 IMPLANT
KIT ROOM TURNOVER OR (KITS) ×3 IMPLANT
MANIFOLD NEPTUNE II (INSTRUMENTS) ×3 IMPLANT
NEEDLE 18GX1X1/2 (RX/OR ONLY) (NEEDLE) IMPLANT
NEEDLE 22X1 1/2 (OR ONLY) (NEEDLE) ×3 IMPLANT
NS IRRIG 1000ML POUR BTL (IV SOLUTION) ×3 IMPLANT
PACK TOTAL JOINT (CUSTOM PROCEDURE TRAY) ×3 IMPLANT
PACK UNIVERSAL I (CUSTOM PROCEDURE TRAY) IMPLANT
PAD ARMBOARD 7.5X6 YLW CONV (MISCELLANEOUS) ×3 IMPLANT
PADDING CAST COTTON 6X4 STRL (CAST SUPPLIES) ×3 IMPLANT
SET HNDPC FAN SPRY TIP SCT (DISPOSABLE) ×1 IMPLANT
STAPLER VISISTAT 35W (STAPLE) IMPLANT
SUCTION FRAZIER HANDLE 10FR (MISCELLANEOUS) ×2
SUCTION TUBE FRAZIER 10FR DISP (MISCELLANEOUS) ×1 IMPLANT
SUT BONE WAX W31G (SUTURE) IMPLANT
SUT PDS AB 0 CT 36 (SUTURE) IMPLANT
SUT PDS AB 1 CT  36 (SUTURE)
SUT PDS AB 1 CT 36 (SUTURE) IMPLANT
SUT PDS AB 2-0 CT1 27 (SUTURE) IMPLANT
SUT VIC AB 0 CTB1 27 (SUTURE) IMPLANT
SUT VIC AB 1 CT1 27 (SUTURE)
SUT VIC AB 1 CT1 27XBRD ANBCTR (SUTURE) IMPLANT
SUT VIC AB 2-0 CTB1 (SUTURE) IMPLANT
SWAB COLLECTION DEVICE MRSA (MISCELLANEOUS) IMPLANT
SWAB CULTURE ESWAB REG 1ML (MISCELLANEOUS) IMPLANT
SYR 20CC LL (SYRINGE) IMPLANT
SYR CONTROL 10ML LL (SYRINGE) IMPLANT
SYRINGE 60CC LL (MISCELLANEOUS) ×3 IMPLANT
TOWEL OR 17X24 6PK STRL BLUE (TOWEL DISPOSABLE) ×3 IMPLANT
TOWEL OR 17X26 10 PK STRL BLUE (TOWEL DISPOSABLE) ×3 IMPLANT
TRAY FOLEY W/METER SILVER 16FR (SET/KITS/TRAYS/PACK) IMPLANT
WATER STERILE IRR 1000ML POUR (IV SOLUTION) IMPLANT
WRAP KNEE MAXI GEL POST OP (GAUZE/BANDAGES/DRESSINGS) ×3 IMPLANT

## 2017-07-18 NOTE — Evaluation (Addendum)
Physical Therapy Evaluation Patient Details Name: Riley Grant MRN: [id number] DOB: 03-02-1971 Today's Date: 07/18/2017   History of Present Illness  Pt is a 46 y/o male s/p R total knee revision. PMH includes R TKA, CAD, NSTEMI, HTN, and R rotator cuff repair.   Clinical Impression  Pt s/p surgery above with deficits below. PTA, pt was independent with ambulation with occasional use of cane. Upon eval, pt limited by post op pain and weakness. Tolerated gait training well and required min guard assist for safety. Pt reports wife will be able to assist as needed at d/c and has all DME. Follow up recommendations per MD arrangements. Will continue to follow acutely to maximize functional mobility independence and safety.     Follow Up Recommendations DC plan and follow up therapy as arranged by surgeon;Supervision/Assistance - 24 hour    Equipment Recommendations  None recommended by PT    Recommendations for Other Services       Precautions / Restrictions Precautions Precautions: Knee Precaution Booklet Issued: Yes (comment) Precaution Comments: Reviewed supine ther ex with pt.  Restrictions Weight Bearing Restrictions: Yes RLE Weight Bearing: Weight bearing as tolerated      Mobility  Bed Mobility Overal bed mobility: Modified Independent             General bed mobility comments: Increased time, but no assist required.   Transfers Overall transfer level: Needs assistance Equipment used: Rolling walker (2 wheeled) Transfers: Sit to/from Stand Sit to Stand: Min guard         General transfer comment: Min guard for steadying. Verbal cues for safe hand placement.   Ambulation/Gait Ambulation/Gait assistance: Min guard Ambulation Distance (Feet): 150 Feet Assistive device: Rolling walker (2 wheeled) Gait Pattern/deviations: Step-through pattern;Decreased stride length;Antalgic Gait velocity: Decreased Gait velocity interpretation: Below normal speed for  age/gender General Gait Details: Antalgic gait secondary to post op pain and weakness. Min guard for safety. Verbal cues to increase DF on RLE to encourage heel strike.   Stairs            Wheelchair Mobility    Modified Rankin (Stroke Patients Only)       Balance Overall balance assessment: Needs assistance Sitting-balance support: No upper extremity supported;Feet supported Sitting balance-Leahy Scale: Good     Standing balance support: Bilateral upper extremity supported;No upper extremity supported;During functional activity Standing balance-Leahy Scale: Fair Standing balance comment: Able to maintain static standing in order to pull shorts up without UE support                              Pertinent Vitals/Pain Pain Assessment: 0-10 Pain Score: 9  Pain Location: R knee  Pain Descriptors / Indicators: Aching;Operative site guarding;Sore    Home Living Family/patient expects to be discharged to:: Private residence Living Arrangements: Spouse/significant other Available Help at Discharge: Family;Available 24 hours/day Type of Home: House Home Access: Stairs to enter Entrance Stairs-Rails: Right;Left;Can reach both Entrance Stairs-Number of Steps: 2 Home Layout: One level Home Equipment: Walker - 2 wheels;Cane - single point;Other (comment) (CPM )      Prior Function Level of Independence: Independent with assistive device(s)         Comments: Occasionally used the cane     Hand Dominance        Extremity/Trunk Assessment        Lower Extremity Assessment Lower Extremity Assessment: RLE deficits/detail RLE Deficits / Details: R knee numbness. Able  to perform exercises below. Deficits consistent with post op pain and weakness.     Cervical / Trunk Assessment Cervical / Trunk Assessment: Normal  Communication   Communication: No difficulties  Cognition Arousal/Alertness: Awake/alert Behavior During Therapy: WFL for tasks  assessed/performed Overall Cognitive Status: Within Functional Limits for tasks assessed                                        General Comments General comments (skin integrity, edema, etc.): Pt's wife present during session.     Exercises Total Joint Exercises Ankle Circles/Pumps: AROM;Both;10 reps;Supine Quad Sets: AROM;Right;10 reps;Supine Towel Squeeze: AROM;Both;10 reps;Supine Short Arc Quad: AROM;Both;10 reps;Supine Heel Slides: AROM;Right;10 reps;Supine Hip ABduction/ADduction: AROM;Right;10 reps;Supine   Assessment/Plan    PT Assessment Patient needs continued PT services  PT Problem List Decreased strength;Decreased range of motion;Decreased balance;Decreased mobility;Decreased knowledge of use of DME;Decreased knowledge of precautions;Pain       PT Treatment Interventions DME instruction;Gait training;Stair training;Functional mobility training;Therapeutic activities;Therapeutic exercise;Balance training;Neuromuscular re-education;Patient/family education    PT Goals (Current goals can be found in the Care Plan section)  Acute Rehab PT Goals Patient Stated Goal: to go home  PT Goal Formulation: With patient Time For Goal Achievement: 07/25/17 Potential to Achieve Goals: Good    Frequency 7X/week   Barriers to discharge        Co-evaluation               AM-PAC PT "6 Clicks" Daily Activity  Outcome Measure Difficulty turning over in bed (including adjusting bedclothes, sheets and blankets)?: A Little Difficulty moving from lying on back to sitting on the side of the bed? : A Little Difficulty sitting down on and standing up from a chair with arms (e.g., wheelchair, bedside commode, etc,.)?: Total Help needed moving to and from a bed to chair (including a wheelchair)?: A Little Help needed walking in hospital room?: A Little Help needed climbing 3-5 steps with a railing? : A Little 6 Click Score: 16    End of Session Equipment  Utilized During Treatment: Gait belt Activity Tolerance: Patient tolerated treatment well Patient left: in chair;with call bell/phone within reach;with family/visitor present;with nursing/sitter in room Nurse Communication: Mobility status PT Visit Diagnosis: Other abnormalities of gait and mobility (R26.89);Pain Pain - Right/Left: Right Pain - part of body: Knee    Time: 3007-6226 PT Time Calculation (min) (ACUTE ONLY): 20 min   Charges:   PT Evaluation $PT Eval Low Complexity: 1 Procedure PT Treatments $Gait Training: 8-22 mins   PT G Codes:   PT G-Codes **NOT FOR INPATIENT CLASS** Functional Assessment Tool Used: AM-PAC 6 Clicks Basic Mobility;Clinical judgement Functional Limitation: Mobility: Walking and moving around Mobility: Walking and Moving Around Current Status (J3354): At least 40 percent but less than 60 percent impaired, limited or restricted Mobility: Walking and Moving Around Goal Status [phone]): At least 1 percent but less than 20 percent impaired, limited or restricted    Leighton Ruff, PT, DPT  Acute Rehabilitation Services  Pager: [place] 07/18/2017, 5:58 PM

## 2017-07-18 NOTE — Anesthesia Procedure Notes (Signed)
Procedure Name: LMA Insertion Date/Time: 07/18/2017 7:22 AM Performed by: Freddie Breech Pre-anesthesia Checklist: Patient identified, Emergency Drugs available, Suction available and Patient being monitored Patient Re-evaluated:Patient Re-evaluated prior to induction Oxygen Delivery Method: Circle System Utilized Preoxygenation: Pre-oxygenation with 100% oxygen Induction Type: IV induction Ventilation: Mask ventilation without difficulty LMA: LMA inserted LMA Size: 5.0 Number of attempts: 1 Airway Equipment and Method: Bite block Placement Confirmation: positive ETCO2 Tube secured with: Tape Dental Injury: Teeth and Oropharynx as per pre-operative assessment

## 2017-07-18 NOTE — Anesthesia Postprocedure Evaluation (Signed)
Anesthesia Post Note  Patient: Riley Grant  Procedure(s) Performed: Procedure(s) (LRB): RIGHT KNEE POLY EXCHANGE    (Right)     Patient location during evaluation: PACU Anesthesia Type: General Level of consciousness: awake and alert Pain management: pain level controlled Vital Signs Assessment: post-procedure vital signs reviewed and stable Respiratory status: spontaneous breathing, nonlabored ventilation, respiratory function stable and patient connected to nasal cannula oxygen Cardiovascular status: blood pressure returned to baseline and stable Postop Assessment: no signs of nausea or vomiting Anesthetic complications: no    Last Vitals:  Vitals:   07/18/17 0922 07/18/17 0930  BP: 129/88   Pulse: 86 85  Resp: 16 12  Temp:      Last Pain:  Vitals:   07/18/17 0910  TempSrc: Tympanic  PainSc:                  Orry Sigl

## 2017-07-18 NOTE — Anesthesia Procedure Notes (Signed)
Anesthesia Regional Block: Adductor canal block   Pre-Anesthetic Checklist: ,, timeout performed, Correct Patient, Correct Site, Correct Laterality, Correct Procedure, Correct Position, site marked, Risks and benefits discussed,  Surgical consent,  Pre-op evaluation,  At surgeon's request and post-op pain management  Laterality: Right  Prep: chloraprep       Needles:  Injection technique: Single-shot  Needle Type: Echogenic Stimulator Needle     Needle Length: 5cm  Needle Gauge: 22     Additional Needles:   Procedures: ultrasound guided, nerve stimulator,,,,,,   Nerve Stimulator or Paresthesia:  Response: quadraceps contraction, 0.45 mA,   Additional Responses:   Narrative:  Start time: 07/18/2017 7:06 AM End time: 07/18/2017 7:15 AM Injection made incrementally with aspirations every 5 mL.  Performed by: With CRNAs  Anesthesiologist: Joylene Wescott  Additional Notes: Functioning IV was confirmed and monitors were applied.  A 52mm 22ga Arrow echogenic stimulator needle was used. Sterile prep and drape,hand hygiene and sterile gloves were used. Ultrasound guidance: relevant anatomy identified, needle position confirmed, local anesthetic spread visualized around nerve(s)., vascular puncture avoided.  Image printed for medical record. Negative aspiration and negative test dose prior to incremental administration of local anesthetic. The patient tolerated the procedure well.

## 2017-07-18 NOTE — Progress Notes (Signed)
Orthopedic Tech Progress Note Patient Details:  Riley Grant 19-Oct-1971 [id number]  CPM on at 0-90 right knee/leg. Bone foam supplied at bedside.      Kristopher Oppenheim 07/18/2017, 11:07 AM

## 2017-07-18 NOTE — Transfer of Care (Signed)
Immediate Anesthesia Transfer of Care Note  Patient: Riley Grant  Procedure(s) Performed: Procedure(s): RIGHT KNEE POLY EXCHANGE    (Right)  Patient Location: PACU  Anesthesia Type:General and GA combined with regional for post-op pain  Level of Consciousness: drowsy and patient cooperative  Airway & Oxygen Therapy: Patient Spontanous Breathing and Patient connected to face mask oxygen  Post-op Assessment: Report given to RN and Post -op Vital signs reviewed and stable  Post vital signs: Reviewed and stable  Last Vitals:  Vitals:   07/18/17 0606 07/18/17 0901  BP: 130/81   Pulse: 71 96  Resp: 18 (!) 25  Temp: 36.8 C (!) 36.4 C    Last Pain:  Vitals:   07/18/17 0606  TempSrc: Oral  PainSc:          Complications: No apparent anesthesia complications

## 2017-07-19 LAB — BASIC METABOLIC PANEL
Anion gap: 7 (ref 5–15)
BUN: 15 mg/dL (ref 6–20)
CALCIUM: 8.5 mg/dL — AB (ref 8.9–10.3)
CO2: 25 mmol/L (ref 22–32)
Chloride: 101 mmol/L (ref 101–111)
Creatinine, Ser: 1.02 mg/dL (ref 0.61–1.24)
GFR calc Af Amer: >60 mL/min (ref >60)
GLUCOSE: 209 mg/dL — AB (ref 65–99)
Potassium: 3.9 mmol/L (ref 3.5–5.1)
Sodium: 133 mmol/L — ABNORMAL LOW (ref 135–145)

## 2017-07-19 LAB — CBC
HEMATOCRIT: 38.5 % — AB (ref 39.0–52.0)
Hemoglobin: 13.7 g/dL (ref 13.0–17.0)
MCH: 29.3 pg (ref 26.0–34.0)
MCHC: 35.6 g/dL (ref 30.0–36.0)
MCV: 82.3 fL (ref 78.0–100.0)
PLATELETS: 276 10*3/uL (ref 150–400)
RBC: 4.68 MIL/uL (ref 4.22–5.81)
RDW: 13.5 % (ref 11.5–15.5)
WBC: 14.7 10*3/uL — ABNORMAL HIGH (ref 4.0–10.5)
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
## 2017-07-19 NOTE — Care Management Note (Signed)
Case Management Note  Patient Details  Name: Riley Grant MRN: [id number] Date of Birth: Jun 20, 1971  Subjective/Objective:   Right TKA                 Action/Plan: Discharge Planning: NCM spoke to pt and states he will be going to outpt PT. States his insurance did not cover HHPT. He has CPM, RW and 3n1 bedside commode that was delivered to his home. Pt states he has insurance but not showing. Unit RN to make a copy and will fax to admission office.    Expected Discharge Date:  07/19/17               Expected Discharge Plan:  Home/Self Care  In-House Referral:  NA  Discharge planning Services  CM Consult  Post Acute Care Choice:  NA Choice offered to:  NA  DME Arranged:  3-N-1, CPM, Walker rolling DME Agency:  [employer]  HH Arranged:  NA HH Agency:  NA  Status of Service:  Completed, signed off  If discussed at [employer], dates discussed:    Additional Comments:  Erenest Rasher, RN 07/19/2017, 9:44 AM

## 2017-07-19 NOTE — Evaluation (Signed)
Occupational Therapy Evaluation and Discharge Patient Details Name: Riley Grant MRN: [id number] DOB: 05/02/1971 Today's Date: 07/19/2017    History of Present Illness Pt is a 46 y/o male s/p R total knee revision. PMH includes R TKA, CAD, NSTEMI, HTN, and R rotator cuff repair.    Clinical Impression   Pt reports he was independent with ADL PTA. Currently pt mod I with ADL and functional mobility without use of AD. Pt planning to d/c home with supervision from family. No further acute OT needs identified; signing off at this time. Please re-consult if needs change. Thank you for this referral.    Follow Up Recommendations  No OT follow up;Supervision - Intermittent    Equipment Recommendations  None recommended by OT    Recommendations for Other Services       Precautions / Restrictions Precautions Precautions: Knee Precaution Booklet Issued: No Restrictions Weight Bearing Restrictions: Yes RLE Weight Bearing: Weight bearing as tolerated      Mobility Bed Mobility               General bed mobility comments: Pt OOB in chair  Transfers Overall transfer level: Modified independent               General transfer comment: No use of AD    Balance Overall balance assessment: No apparent balance deficits (not formally assessed)                                         ADL either performed or assessed with clinical judgement   ADL Overall ADL's : Modified independent                                       General ADL Comments: Pt able to perform simulated LB ADL, toilet transfer and simulated shower transfer without use of AD; no unsteadiness or LOB noted     Vision         Perception     Praxis      Pertinent Vitals/Pain Pain Assessment: Faces Faces Pain Scale: Hurts a little bit Pain Location: R knee Pain Descriptors / Indicators: Sore Pain Intervention(s): Monitored during session     Hand Dominance      Extremity/Trunk Assessment Upper Extremity Assessment Upper Extremity Assessment: Overall WFL for tasks assessed   Lower Extremity Assessment Lower Extremity Assessment: Defer to PT evaluation   Cervical / Trunk Assessment Cervical / Trunk Assessment: Normal   Communication Communication Communication: No difficulties   Cognition Arousal/Alertness: Awake/alert Behavior During Therapy: WFL for tasks assessed/performed Overall Cognitive Status: Within Functional Limits for tasks assessed                                     General Comments       Exercises     Shoulder Instructions      Home Living Family/patient expects to be discharged to:: Private residence Living Arrangements: Spouse/significant other Available Help at Discharge: Family;Available 24 hours/day Type of Home: House Home Access: Stairs to enter [employer] of Steps: 2 Entrance Stairs-Rails: Right;Left;Can reach both Home Layout: One level     Bathroom Shower/Tub: Occupational psychologist: Handicapped height     Home Equipment:  Walker - 2 wheels;Cane - single point;Other (comment) (CPM)          Prior Functioning/Environment Level of Independence: Independent with assistive device(s)        Comments: Occasionally used the cane        OT Problem List:        OT Treatment/Interventions:      OT Goals(Current goals can be found in the care plan section) Acute Rehab OT Goals Patient Stated Goal: home ASAP OT Goal Formulation: All assessment and education complete, DC therapy  OT Frequency:     Barriers to D/C:            Co-evaluation              AM-PAC PT "6 Clicks" Daily Activity     Outcome Measure Help from another person eating meals?: None Help from another person taking care of personal grooming?: None Help from another person toileting, which includes using toliet, bedpan, or urinal?: None Help from another person bathing  (including washing, rinsing, drying)?: None Help from another person to put on and taking off regular upper body clothing?: None Help from another person to put on and taking off regular lower body clothing?: None 6 Click Score: 24   End of Session CPM Right Knee CPM Right Knee: Off  Activity Tolerance: Patient tolerated treatment well Patient left: in chair;with call bell/phone within reach  OT Visit Diagnosis: Pain Pain - Right/Left: Right Pain - part of body: Knee                Time: 0818-0828 OT Time Calculation (min): 10 min Charges:  OT General Charges $OT Visit: 1 Procedure OT Evaluation $OT Eval Low Complexity: 1 Procedure G-Codes: OT G-codes **NOT FOR INPATIENT CLASS** Functional Assessment Tool Used: AM-PAC 6 Clicks Daily Activity Functional Limitation: Self care Self Care Current Status (Z3007): 0 percent impaired, limited or restricted Self Care Goal Status (M2263): 0 percent impaired, limited or restricted Self Care Discharge Status (F3545): 0 percent impaired, limited or restricted   Mel Almond A. Ulice Brilliant, M.S., OTR/L Pager: [place] 07/19/2017, 8:31 AM

## 2017-07-19 NOTE — Progress Notes (Signed)
Physical Therapy Treatment Patient Details Name: Riley Grant MRN: [id number] DOB: Jan 17, 1971 Today's Date: 07/19/2017    History of Present Illness Pt is a 46 y/o male s/p R total knee revision. PMH includes R TKA, CAD, NSTEMI, HTN, and R rotator cuff repair.     PT Comments    Patient is making good progress with PT.  From a mobility standpoint anticipate patient will be ready for DC home medically ready.    Follow Up Recommendations  DC plan and follow up therapy as arranged by surgeon;Supervision/Assistance - 24 hour     Equipment Recommendations  None recommended by PT    Recommendations for Other Services       Precautions / Restrictions Precautions Precautions: Knee Precaution Booklet Issued: No Precaution Comments: reviewed precautions/positioning with pt Restrictions Weight Bearing Restrictions: Yes RLE Weight Bearing: Weight bearing as tolerated    Mobility  Bed Mobility Overal bed mobility: Independent             General bed mobility comments: Pt OOB in chair  Transfers Overall transfer level: Modified independent               General transfer comment: increased effort  Ambulation/Gait Ambulation/Gait assistance: Supervision Ambulation Distance (Feet): 150 Feet   Gait Pattern/deviations: Step-through pattern;Decreased stride length     General Gait Details: no knee instability noted; improved R heel strike   Stairs Stairs: Yes   Stair Management: One rail Right;Step to pattern;Forwards Number of Stairs: 10 General stair comments: cues for sequencing  Wheelchair Mobility    Modified Rankin (Stroke Patients Only)       Balance Overall balance assessment: No apparent balance deficits (not formally assessed)                                          Cognition Arousal/Alertness: Awake/alert Behavior During Therapy: WFL for tasks assessed/performed Overall Cognitive Status: Within Functional Limits for tasks  assessed                                        Exercises Total Joint Exercises Quad Sets: AROM;Right;10 reps;Supine Heel Slides: AROM;Right;10 reps;Supine Hip ABduction/ADduction: AROM;Right;10 reps;Supine Straight Leg Raises: AROM;Right;10 reps Long Arc Quad: AROM;Right;10 reps Goniometric ROM: 0-90    General Comments        Pertinent Vitals/Pain Pain Assessment: 0-10 Pain Score: 6  Faces Pain Scale: Hurts a little bit Pain Location: R knee  Pain Descriptors / Indicators: Sore Pain Intervention(s): Monitored during session;Repositioned    Home Living Family/patient expects to be discharged to:: Private residence Living Arrangements: Spouse/significant other Available Help at Discharge: Family;Available 24 hours/day Type of Home: House Home Access: Stairs to enter Entrance Stairs-Rails: Right;Left;Can reach both Home Layout: One level Home Equipment: Environmental consultant - 2 wheels;Cane - single point;Other (comment) (CPM)      Prior Function Level of Independence: Independent with assistive device(s)      Comments: Occasionally used the cane   PT Goals (current goals can now be found in the care plan section) Acute Rehab PT Goals Patient Stated Goal: home ASAP PT Goal Formulation: With patient Time For Goal Achievement: 07/25/17 Potential to Achieve Goals: Good Progress towards PT goals: Progressing toward goals    Frequency    7X/week      PT  Plan Current plan remains appropriate    Co-evaluation              AM-PAC PT "6 Clicks" Daily Activity  Outcome Measure  Difficulty turning over in bed (including adjusting bedclothes, sheets and blankets)?: None Difficulty moving from lying on back to sitting on the side of the bed? : None Difficulty sitting down on and standing up from a chair with arms (e.g., wheelchair, bedside commode, etc,.)?: None Help needed moving to and from a bed to chair (including a wheelchair)?: None Help needed  walking in hospital room?: None Help needed climbing 3-5 steps with a railing? : A Little 6 Click Score: 23    End of Session Equipment Utilized During Treatment: Gait belt Activity Tolerance: Patient tolerated treatment well Patient left: in chair;with call bell/phone within reach Nurse Communication: Mobility status PT Visit Diagnosis: Other abnormalities of gait and mobility (R26.89);Pain Pain - Right/Left: Right Pain - part of body: Knee     Time: 0825-0836 PT Time Calculation (min) (ACUTE ONLY): 11 min  Charges:  $Gait Training: 8-22 mins                    G Codes:       Riley Grant, PTA Pager: [phone]     Darliss Cheney 07/19/2017, 8:44 AM

## 2017-07-19 NOTE — Progress Notes (Signed)
Pt discharged to home in stable condition, discharge instructions and medications reviewed with pt, Rx's x3 given to pt, pt ambulated off unit, steady gate with stand by assist for safety by this nurse.  [id number]

## 2017-07-19 NOTE — Progress Notes (Signed)
Patient ID: Riley Grant, male   DOB: 1971-07-14, 46 y.o.   MRN: [id number]     Subjective:  Patient reports pain as mild.  Sitting up in the chair and reports that he is ready for DC  Objective:   VITALS:   Vitals:   07/18/17 1544 07/18/17 2040 07/19/17 0014 07/19/17 0504  BP: 130/81 126/75 118/63 114/64  Pulse: 91 91 82 89  Resp: 18 14 16 18   Temp: 98 F (36.7 C) 97.9 F (36.6 C) 98.4 F (36.9 C) 98.2 F (36.8 C)  TempSrc: Oral Oral Oral Oral  SpO2: 98% 98% 97% 98%  Weight:      Height:        ABD soft Sensation intact distally Dorsiflexion/Plantar flexion intact Incision: dressing C/D/I and no drainage   Lab Results  Component Value Date   WBC 14.7 (H) 07/19/2017   HGB 13.7 07/19/2017   HCT 38.5 (L) 07/19/2017   MCV 82.3 07/19/2017   PLT 276 07/19/2017   BMET    Component Value Date/Time   NA 133 (L) 07/19/2017 0437   K 3.9 07/19/2017 0437   CL 101 07/19/2017 0437   CO2 25 07/19/2017 0437   GLUCOSE 209 (H) 07/19/2017 0437   BUN 15 07/19/2017 0437   CREATININE 1.02 07/19/2017 0437   CREATININE 0.98 03/03/2015 1606   CALCIUM 8.5 (L) 07/19/2017 0437   GFRNONAA >60 07/19/2017 0437   GFRAA >60 07/19/2017 0437     Assessment/Plan: 1 Day Post-Op   Active Problems:   S/P revision of total knee   Advance diet Up with therapy DC home  Continue plan per DR Scot Dock, Erlene Quan 07/19/2017, 9:26 AM  Discussed and agree with above.   Marchia Bond, MD Cell [phone]

## 2017-07-22 ENCOUNTER — Encounter (HOSPITAL_COMMUNITY): Payer: Self-pay | Admitting: Orthopedic Surgery

## 2017-07-22 NOTE — Op Note (Signed)
NAMERUCKER, PRIDGEON                ACCOUNT NO.:  [id number]  MEDICAL RECORD NO.:  [id number]  LOCATION:  [facility]                         FACILITY:  [facility]  PHYSICIAN:  Estill Bamberg. Ronnie Derby, M.D. DATE OF BIRTH:  September 18, 1971  DATE OF PROCEDURE:  07/18/2017 DATE OF DISCHARGE:                              OPERATIVE REPORT   SURGEON:  Estill Bamberg. Ronnie Derby, M.D.  ASSISTANT:  Grier Mitts, PA-C.  ANESTHESIA:  General.  PREOPERATIVE DIAGNOSIS:  Unstable right knee total knee replacement.  POSTOPERATIVE DIAGNOSIS:  Unstable right knee total knee replacement.  PROCEDURE:  Right revision total knee arthroplasty with polyethylene exchange.  INDICATION FOR PROCEDURE:  The patient is a 46 year old, status post [place].  He is experiencing extreme instability and I felt he had instability with a polyethylene that was too small.  Informed consent was obtained for surgery.  DESCRIPTION OF PROCEDURE:  The patient was laid supine and administered general anesthesia.  The right knee was prepped and draped in the usual fashion.  The extremity was exsanguinated with the Esmarch and the tourniquet inflated to __________.  His old midline incision was used and made with a #10 blade __________ used to make a median parapatellar arthrotomy and perform an aggressive synovectomy.  Evaluation of the knee did show a good 4 to 6 mm above medially and laterally, but a fairly well-balanced knee otherwise.  I removed the polyethylene and irrigated copiously.  I then trialed with a size __________ completely eliminated the instability.  I opened the __________ and snapped it into place and then lavaged with 1000 mL of LR.  I then let the tourniquet down at 22 minutes and obtained hemostasis.  I then closed the arthrotomy with a figure-of-eight #1 Vicryl sutures, buried 0 Vicryl sutures, subcuticular 2-0 Vicryl sutures, and 4-0 Monocryl.  I dressed with Xeroform and an Adaptic  dressing.  COMPLICATIONS:  None.  DRAINS:  None.          ______________________________ Estill Bamberg. Ronnie Derby, M.D.     SDL/MEDQ  D:  07/22/2017  T:  07/22/2017  Job:  [id number]

## 2017-07-22 NOTE — Op Note (Signed)
Dictation Number:  [id number]

## 2017-12-22 ENCOUNTER — Emergency Department (HOSPITAL_COMMUNITY): Payer: Self-pay

## 2017-12-22 ENCOUNTER — Encounter (HOSPITAL_COMMUNITY): Payer: Self-pay

## 2017-12-22 ENCOUNTER — Other Ambulatory Visit: Payer: Self-pay

## 2017-12-22 ENCOUNTER — Emergency Department (HOSPITAL_COMMUNITY)
Admission: EM | Admit: 2017-12-22 | Discharge: 2017-12-22 | Disposition: A | Discharge status: Home / Self Care | Payer: Self-pay | Attending: Emergency Medicine | Admitting: Emergency Medicine

## 2017-12-22 DIAGNOSIS — I10 Essential (primary) hypertension: Secondary | ICD-10-CM | POA: Insufficient documentation

## 2017-12-22 DIAGNOSIS — I2510 Atherosclerotic heart disease of native coronary artery without angina pectoris: Secondary | ICD-10-CM | POA: Insufficient documentation

## 2017-12-22 DIAGNOSIS — R197 Diarrhea, unspecified: Secondary | ICD-10-CM | POA: Insufficient documentation

## 2017-12-22 DIAGNOSIS — Z8673 Personal history of transient ischemic attack (TIA), and cerebral infarction without residual deficits: Secondary | ICD-10-CM | POA: Insufficient documentation

## 2017-12-22 DIAGNOSIS — Z79899 Other long term (current) drug therapy: Secondary | ICD-10-CM | POA: Insufficient documentation

## 2017-12-22 DIAGNOSIS — R0789 Other chest pain: Secondary | ICD-10-CM | POA: Insufficient documentation

## 2017-12-22 DIAGNOSIS — R1110 Vomiting, unspecified: Secondary | ICD-10-CM | POA: Insufficient documentation

## 2017-12-22 DIAGNOSIS — R112 Nausea with vomiting, unspecified: Secondary | ICD-10-CM

## 2017-12-22 LAB — CBC
HEMATOCRIT: 49 % (ref 39.0–52.0)
HEMOGLOBIN: 17.3 g/dL — AB (ref 13.0–17.0)
MCH: 29.8 pg (ref 26.0–34.0)
MCHC: 35.3 g/dL (ref 30.0–36.0)
MCV: 84.5 fL (ref 78.0–100.0)
Platelets: 311 10*3/uL (ref 150–400)
RBC: 5.8 MIL/uL (ref 4.22–5.81)
RDW: 13.6 % (ref 11.5–15.5)
WBC: 15.7 10*3/uL — ABNORMAL HIGH (ref 4.0–10.5)

## 2017-12-22 LAB — URINALYSIS, ROUTINE W REFLEX MICROSCOPIC
Bilirubin Urine: NEGATIVE
Glucose, UA: NEGATIVE mg/dL
Hgb urine dipstick: NEGATIVE
KETONES UR: NEGATIVE mg/dL
LEUKOCYTES UA: NEGATIVE
NITRITE: NEGATIVE
PH: 5 (ref 5.0–8.0)
Protein, ur: NEGATIVE mg/dL
Specific Gravity, Urine: 1.025 (ref 1.005–1.030)

## 2017-12-22 LAB — LIPASE, BLOOD: Lipase: 34 U/L (ref 11–51)

## 2017-12-22 LAB — BASIC METABOLIC PANEL
ANION GAP: 10 (ref 5–15)
BUN: 16 mg/dL (ref 6–20)
CO2: 21 mmol/L — AB (ref 22–32)
Calcium: 9.4 mg/dL (ref 8.9–10.3)
Chloride: 103 mmol/L (ref 101–111)
Creatinine, Ser: 0.98 mg/dL (ref 0.61–1.24)
GFR calc Af Amer: >60 mL/min (ref >60)
GLUCOSE: 139 mg/dL — AB (ref 65–99)
POTASSIUM: 4.3 mmol/L (ref 3.5–5.1)
Sodium: 134 mmol/L — ABNORMAL LOW (ref 135–145)

## 2017-12-22 LAB — HEPATIC FUNCTION PANEL
ALBUMIN: 4.2 g/dL (ref 3.5–5.0)
ALT: 58 U/L (ref 17–63)
AST: 38 U/L (ref 15–41)
Alkaline Phosphatase: 77 U/L (ref 38–126)
BILIRUBIN TOTAL: 1.6 mg/dL — AB (ref 0.3–1.2)
Bilirubin, Direct: 0.2 mg/dL (ref 0.1–0.5)
Indirect Bilirubin: 1.4 mg/dL — ABNORMAL HIGH (ref 0.3–0.9)
TOTAL PROTEIN: 8 g/dL (ref 6.5–8.1)

## 2017-12-22 LAB — I-STAT TROPONIN, ED
TROPONIN I, POC: 0 ng/mL (ref 0.00–0.08)
Troponin i, poc: 0 ng/mL (ref 0.00–0.08)

## 2017-12-22 MED ORDER — SODIUM CHLORIDE 0.9 % IV BOLUS (SEPSIS)
1000.0000 mL | Freq: Once | INTRAVENOUS | Status: AC
  Administered 2017-12-22: 1000 mL via INTRAVENOUS

## 2017-12-22 MED ORDER — PROCHLORPERAZINE EDISYLATE 5 MG/ML IJ SOLN
10.0000 mg | Freq: Once | INTRAMUSCULAR | Status: AC
  Administered 2017-12-22: 10 mg via INTRAVENOUS
  Filled 2017-12-22: qty 2

## 2017-12-22 MED ORDER — DIPHENHYDRAMINE HCL 50 MG/ML IJ SOLN
25.0000 mg | Freq: Once | INTRAMUSCULAR | Status: AC
  Administered 2017-12-22: 25 mg via INTRAVENOUS
  Filled 2017-12-22: qty 1

## 2017-12-22 MED ORDER — ONDANSETRON HCL 4 MG/2ML IJ SOLN
4.0000 mg | Freq: Once | INTRAMUSCULAR | Status: AC
  Administered 2017-12-22: 4 mg via INTRAVENOUS
  Filled 2017-12-22: qty 2

## 2017-12-22 MED ORDER — IOPAMIDOL (ISOVUE-300) INJECTION 61%
INTRAVENOUS | Status: AC
Start: 2017-12-22 — End: 2017-12-22
  Administered 2017-12-22: 100 mL
  Filled 2017-12-22: qty 100

## 2017-12-22 MED ORDER — PROMETHAZINE HCL 25 MG PO TABS: 25.0000 mg | ORAL_TABLET | Freq: Four times a day (QID) | ORAL | 0 refills | Status: DC | PRN

## 2017-12-22 MED ORDER — ONDANSETRON 4 MG PO TBDP
4.0000 mg | ORAL_TABLET | Freq: Once | ORAL | Status: AC | PRN
  Administered 2017-12-22: 4 mg via ORAL
  Filled 2017-12-22: qty 1

## 2017-12-22 NOTE — ED Notes (Signed)
Patient transported to X-ray 
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
## 2017-12-22 NOTE — ED Notes (Signed)
Pt stable, ambulatory, states understanding of discharge instructions 

## 2017-12-22 NOTE — Discharge Instructions (Signed)
TAKE LACTOBACILLUS (PROBIOTICS) FOR DIARRHEA. DRINK PLENTY OF FLUIDS. RETURN TO ER IF ANY SYMPTOMS WORSEN.

## 2017-12-22 NOTE — ED Notes (Signed)
On way to CT 

## 2017-12-22 NOTE — ED Triage Notes (Signed)
Pt presents for evaluation of chest pain with radiation to L arm and neck. Pt reports nausea/vomiting/diarrhea. States has not been feeling well x 1 week and saw PCP for BP issues. States hx of CVA, MI, and stent placement.

## 2017-12-22 NOTE — ED Notes (Signed)
Patient given water to drink.  

## 2018-11-12 ENCOUNTER — Ambulatory Visit: Payer: Self-pay | Admitting: Internal Medicine

## 2018-12-03 ENCOUNTER — Encounter (INDEPENDENT_AMBULATORY_CARE_PROVIDER_SITE_OTHER): Payer: Self-pay

## 2018-12-03 ENCOUNTER — Ambulatory Visit (INDEPENDENT_AMBULATORY_CARE_PROVIDER_SITE_OTHER): Payer: Self-pay | Admitting: Internal Medicine

## 2018-12-03 ENCOUNTER — Encounter: Payer: Self-pay | Admitting: Internal Medicine

## 2018-12-03 VITALS — BP 124/80 | HR 89 | Ht 68.0 in | Wt 237.4 lb

## 2018-12-03 DIAGNOSIS — I25119 Atherosclerotic heart disease of native coronary artery with unspecified angina pectoris: Secondary | ICD-10-CM

## 2018-12-03 DIAGNOSIS — E119 Type 2 diabetes mellitus without complications: Secondary | ICD-10-CM

## 2018-12-03 DIAGNOSIS — E785 Hyperlipidemia, unspecified: Secondary | ICD-10-CM

## 2018-12-03 DIAGNOSIS — R079 Chest pain, unspecified: Secondary | ICD-10-CM

## 2018-12-03 NOTE — Patient Instructions (Signed)
Medication Instructions:  START aspirin 81mg  daily If you need a refill on your cardiac medications before your next appointment, please call your pharmacy.   Lab work: Fasting lab work to check cholesterol If you have labs (blood work) drawn today and your tests are completely normal, you will receive your results only by: Marland Kitchen MyChart Message (if you have MyChart) OR . A paper copy in the mail If you have any lab test that is abnormal or we need to change your treatment, we will call you to review the results.  Testing/Procedures: Dr. Debara Pickett has ordered a Myocardial Perfusion Imaging Study.   The test will take approximately 3 to 4 hours to complete; you may bring reading material.  If someone comes with you to your appointment, they will need to remain in the main lobby due to limited space in the testing area.   How to prepare for your Myocardial Perfusion Test:  Do not eat or drink 3 hours prior to your test, except you may have water.  Do not consume products containing caffeine (regular or decaffeinated) 12 hours prior to your test. (ex: coffee, chocolate, sodas, tea).  Do wear comfortable clothes (no dresses or overalls) and walking shoes, tennis shoes preferred (No heels or open toe shoes are allowed).  Do NOT wear cologne, perfume, aftershave, or lotions (deodorant is allowed).  If these instructions are not followed, your test will have to be rescheduled.   Follow-Up: At [facility], you and your health needs are our priority.  As part of our continuing mission to provide you with exceptional heart care, we have created designated Provider Care Teams.  These Care Teams include your primary Cardiologist (physician) and Advanced Practice Providers (APPs -  Physician Assistants and Nurse Practitioners) who all work together to provide you with the care you need, when you need it. You will need a follow up appointment in 4 weeks.  You may see Dr. Debara Pickett or one of the following  Advanced Practice Providers on your designated Care Team: Almyra Deforest, [place] . Fabian Sharp, PA-C  Any Other Special Instructions Will Be Listed Below (If Applicable).
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
## 2018-12-04 ENCOUNTER — Telehealth (HOSPITAL_COMMUNITY): Payer: Self-pay

## 2018-12-04 NOTE — Telephone Encounter (Signed)
Encounter complete. 

## 2018-12-08 ENCOUNTER — Telehealth (HOSPITAL_COMMUNITY): Payer: Self-pay

## 2018-12-08 NOTE — Telephone Encounter (Signed)
Encounter complete. 

## 2018-12-09 ENCOUNTER — Other Ambulatory Visit: Payer: Self-pay | Admitting: Internal Medicine

## 2018-12-09 ENCOUNTER — Ambulatory Visit (HOSPITAL_COMMUNITY)
Admission: RE | Admit: 2018-12-09 | Discharge: 2018-12-09 | Disposition: A | Discharge status: Home / Self Care | Payer: BLUE CROSS/BLUE SHIELD | Source: Ambulatory Visit | Attending: Cardiovascular Disease | Admitting: Cardiovascular Disease

## 2018-12-09 DIAGNOSIS — I25119 Atherosclerotic heart disease of native coronary artery with unspecified angina pectoris: Secondary | ICD-10-CM | POA: Insufficient documentation

## 2018-12-09 DIAGNOSIS — R079 Chest pain, unspecified: Secondary | ICD-10-CM

## 2018-12-09 LAB — MYOCARDIAL PERFUSION IMAGING
CHL CUP NUCLEAR SRS: 0
CSEPEW: 12 METS
CSEPPHR: 157 {beats}/min
Exercise duration (min): 10 min
Exercise duration (sec): 30 s
LV dias vol: 104 mL (ref 62–150)
LVSYSVOL: 42 mL
MPHR: 171 {beats}/min
NUC STRESS TID: 0.92
Percent HR: 91 %
RPE: 19
Rest HR: 71 {beats}/min
SDS: 0
SSS: 0

## 2018-12-09 MED ORDER — TECHNETIUM TC 99M TETROFOSMIN IV KIT
8.2000 | PACK | Freq: Once | INTRAVENOUS | Status: AC | PRN
  Administered 2018-12-09: 8.2 via INTRAVENOUS
  Filled 2018-12-09: qty 9

## 2018-12-09 MED ORDER — TECHNETIUM TC 99M TETROFOSMIN IV KIT
26.9000 | PACK | Freq: Once | INTRAVENOUS | Status: AC | PRN
  Administered 2018-12-09: 26.9 via INTRAVENOUS
  Filled 2018-12-09: qty 27

## 2018-12-10 LAB — LIPID PANEL
Chol/HDL Ratio: 3.9 ratio (ref 0.0–5.0)
Cholesterol, Total: 171 mg/dL (ref 100–199)
HDL: 44 mg/dL
LDL Calculated: 90 mg/dL (ref 0–99)
Triglycerides: 187 mg/dL — ABNORMAL HIGH (ref 0–149)
VLDL Cholesterol Cal: 37 mg/dL (ref 5–40)

## 2018-12-10 LAB — HEMOGLOBIN A1C
Est. average glucose Bld gHb Est-mCnc: 269 mg/dL
Hgb A1c MFr Bld: 11 % — ABNORMAL HIGH (ref 4.8–5.6)

## 2018-12-11 ENCOUNTER — Telehealth: Payer: Self-pay | Admitting: Internal Medicine

## 2018-12-11 NOTE — Telephone Encounter (Signed)
°  The patient is calling to request results , states to call him with results.

## 2018-12-11 NOTE — Telephone Encounter (Signed)
New message   Patient's wife is calling to get test results.

## 2018-12-11 NOTE — Telephone Encounter (Signed)
The patient's wife has been notified of the  myoview result and verbalized understanding.  All questions (if any) were answered. Raiford Simmonds, RN 12/11/2018 8:56 AM

## 2018-12-14 ENCOUNTER — Telehealth: Payer: Self-pay | Admitting: Internal Medicine

## 2018-12-14 ENCOUNTER — Encounter: Payer: Self-pay | Admitting: Internal Medicine

## 2018-12-14 NOTE — Telephone Encounter (Signed)
Prior authorization for Repatha [employer] submitted via [email or web address] (Key: [id number])

## 2018-12-14 NOTE — Telephone Encounter (Signed)
Patient has been approved for Repatha from 12/14/2018 through 12/13/2019

## 2018-12-18 MED ORDER — EVOLOCUMAB 140 MG/ML ~~LOC~~ SOAJ: 1.0000 | SUBCUTANEOUS | 11 refills | Status: DC

## 2018-12-18 NOTE — Addendum Note (Signed)
Addended by: Fidel Levy on: 12/18/2018 10:19 AM   Modules accepted: Orders

## 2018-12-18 NOTE — Telephone Encounter (Signed)
Spoke with patient and notified him that Repatha has been approved. Notified patient that [facility] will not be able to fill this and he would like Rx sent to [facility]. Advised patient how to access co-pay card online

## 2019-01-08 ENCOUNTER — Encounter (INDEPENDENT_AMBULATORY_CARE_PROVIDER_SITE_OTHER): Payer: Self-pay

## 2019-01-08 ENCOUNTER — Encounter: Payer: Self-pay | Admitting: Internal Medicine

## 2019-01-08 ENCOUNTER — Ambulatory Visit (INDEPENDENT_AMBULATORY_CARE_PROVIDER_SITE_OTHER): Payer: BLUE CROSS/BLUE SHIELD | Admitting: Internal Medicine

## 2019-01-08 VITALS — BP 142/90 | HR 85 | Ht 68.0 in | Wt 234.0 lb

## 2019-01-08 DIAGNOSIS — E785 Hyperlipidemia, unspecified: Secondary | ICD-10-CM

## 2019-01-08 DIAGNOSIS — R5383 Other fatigue: Secondary | ICD-10-CM | POA: Diagnosis not present

## 2019-01-08 DIAGNOSIS — Z79899 Other long term (current) drug therapy: Secondary | ICD-10-CM

## 2019-01-08 DIAGNOSIS — I25119 Atherosclerotic heart disease of native coronary artery with unspecified angina pectoris: Secondary | ICD-10-CM | POA: Diagnosis not present

## 2019-01-08 DIAGNOSIS — I10 Essential (primary) hypertension: Secondary | ICD-10-CM

## 2019-01-08 MED ORDER — LOSARTAN POTASSIUM 50 MG PO TABS: 50.0000 mg | ORAL_TABLET | Freq: Every day | ORAL | 3 refills | Status: DC
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
## 2019-01-08 NOTE — Telephone Encounter (Signed)
Patient was seen in office today. He reports he has NOT started Repatha but did receive a text alert from [facility] concerning this Rx. Pen-injector demonstrated to patient today. Advised that he contact [facility] and if he has any issues with Rx, please call us

## 2019-01-08 NOTE — Patient Instructions (Signed)
Medication Instructions:  -- START losartan daily for blood pressure -- START repatha - 1 injection every 14 days If you need a refill on your cardiac medications before your next appointment, please call your pharmacy.   Lab work: -- NON-FASTING lab work - BMET - after 1 week on losartan -- FASTING lab work to check cholesterol after you have been on repatha for 3 months (approx. April 2020)  If you have labs (blood work) drawn today and your tests are completely normal, you will receive your results only by: Marland Kitchen MyChart Message (if you have MyChart) OR . A paper copy in the mail If you have any lab test that is abnormal or we need to change your treatment, we will call you to review the results.  Follow-Up: At [facility], you and your health needs are our priority.  As part of our continuing mission to provide you with exceptional heart care, we have created designated Provider Care Teams.  These Care Teams include your primary Cardiologist (physician) and Advanced Practice Providers (APPs -  Physician Assistants and Nurse Practitioners) who all work together to provide you with the care you need, when you need it. You will need a follow up appointment in 3-4 months. You may see Dr. Debara Pickett or one of the following Advanced Practice Providers on your designated Care Team: Almyra Deforest, [place] . Fabian Sharp, PA-C  Any Other Special Instructions Will Be Listed Below (If Applicable).

## 2019-01-11 ENCOUNTER — Telehealth: Payer: Self-pay | Admitting: Internal Medicine

## 2019-01-11 NOTE — Telephone Encounter (Signed)
Letter with lab results and instructions on repatha copay card mailed again to patient

## 2019-01-11 NOTE — Telephone Encounter (Signed)
New message     Pt wife stated that she was suppose to get form in mail to get assistance for repatha but never did . Please follow up

## 2019-01-11 NOTE — Telephone Encounter (Signed)
Spoke with pt wife, she never got the letter and needs to know what web site to go on for the repatha card. Will forward to dr hilty's nurse, Eliezer Lofts

## 2019-01-22 ENCOUNTER — Encounter: Payer: Self-pay | Admitting: Gastroenterology

## 2019-02-02 ENCOUNTER — Encounter: Payer: Self-pay | Admitting: Gastroenterology

## 2019-02-02 ENCOUNTER — Ambulatory Visit (AMBULATORY_SURGERY_CENTER): Payer: Self-pay | Admitting: *Deleted

## 2019-02-02 VITALS — Ht 68.0 in | Wt 241.0 lb

## 2019-02-02 DIAGNOSIS — Z1211 Encounter for screening for malignant neoplasm of colon: Secondary | ICD-10-CM

## 2019-02-02 MED ORDER — PEG 3350-KCL-NA BICARB-NACL 420 G PO SOLR: 4000.0000 mL | Freq: Once | ORAL | 0 refills | Status: AC
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
## 2019-02-08 ENCOUNTER — Telehealth: Payer: Self-pay

## 2019-02-08 ENCOUNTER — Ambulatory Visit (AMBULATORY_SURGERY_CENTER): Payer: BLUE CROSS/BLUE SHIELD | Admitting: Gastroenterology

## 2019-02-08 ENCOUNTER — Encounter: Payer: Self-pay | Admitting: Gastroenterology

## 2019-02-08 VITALS — BP 111/66 | HR 71 | Temp 98.6°F | Resp 10 | Ht 68.0 in | Wt 241.0 lb

## 2019-02-08 DIAGNOSIS — K649 Unspecified hemorrhoids: Secondary | ICD-10-CM

## 2019-02-08 DIAGNOSIS — Z1211 Encounter for screening for malignant neoplasm of colon: Secondary | ICD-10-CM

## 2019-02-08 DIAGNOSIS — D124 Benign neoplasm of descending colon: Secondary | ICD-10-CM

## 2019-02-08 MED ORDER — SODIUM CHLORIDE 0.9 % IV SOLN: 500.0000 mL | Freq: Once | INTRAVENOUS | Status: DC

## 2019-02-08 NOTE — Progress Notes (Signed)
Called to room to assist during endoscopic procedure.  Patient ID and intended procedure confirmed with present staff. Received instructions for my participation in the procedure from the performing physician.  

## 2019-02-08 NOTE — Op Note (Signed)
[facility] Patient Name: Riley Grant Procedure Date: 02/08/2019 7:18 AM MRN: [id number] Endoscopist: Milus Banister , MD Age: 48 Referring MD:  Date of Birth: Feb 27, 1971 Gender: Male Account #: [id number] Procedure:                Colonoscopy Indications:              Screening for colorectal malignant neoplasm Medicines:                Monitored Anesthesia Care Procedure:                Pre-Anesthesia Assessment:                           - Prior to the procedure, a History and Physical                            was performed, and patient medications and                            allergies were reviewed. The patient's tolerance of                            previous anesthesia was also reviewed. The risks                            and benefits of the procedure and the sedation                            options and risks were discussed with the patient.                            All questions were answered, and informed consent                            was obtained. Prior Anticoagulants: The patient has                            taken no previous anticoagulant or antiplatelet                            agents. ASA Grade Assessment: II - A patient with                            mild systemic disease. After reviewing the risks                            and benefits, the patient was deemed in                            satisfactory condition to undergo the procedure.                           After obtaining informed consent, the colonoscope  was passed under direct vision. Throughout the                            procedure, the patient's blood pressure, pulse, and                            oxygen saturations were monitored continuously. The                            Colonoscope was introduced through the anus and                            advanced to the the cecum, identified by                            appendiceal orifice and  ileocecal valve. The                            colonoscopy was performed without difficulty. The                            patient tolerated the procedure well. The quality                            of the bowel preparation was good. The ileocecal                            valve, appendiceal orifice, and rectum were                            photographed. Scope In: 7:54:03 AM Scope Out: 8:15:40 AM Scope Withdrawal Time: 0 hours 18 minutes 39 seconds  Total Procedure Duration: 0 hours 21 minutes 37 seconds  Findings:                 A 7 mm polyp was found in the proximal descending                            colon. The polyp was semi-sessile and umbilicated                            and slightly firm. The polyp was removed with a                            cold snare. Resection and retrieval were complete.                            The site was tattooed with an injection of Spot                            (carbon black) in three locations. jar 1                           A 6 mm polyp was found  in the distal descending                            colon. The polyp was sessile. The polyp was removed                            with a cold snare. Resection and retrieval were                            complete. jar 2                           External and internal hemorrhoids were found. The                            hemorrhoids were small.                           The exam was otherwise without abnormality on                            direct and retroflexion views. Complications:            No immediate complications. Estimated blood loss:                            None. Estimated Blood Loss:     Estimated blood loss: none. Impression:               - One 7 mm polyp in the proximal descending colon,                            removed with a cold snare. Resected and retrieved.                            This site was labeled with submucosal injection of                             Spot.                           - One 6 mm polyp in the distal descending colon,                            removed with a cold snare. Resected and retrieved.                           - External and internal hemorrhoids.                           - The examination was otherwise normal on direct                            and retroflexion views. Recommendation:           - Patient has a contact number available for  emergencies. The signs and symptoms of potential                            delayed complications were discussed with the                            patient. Return to normal activities tomorrow.                            Written discharge instructions were provided to the                            patient.                           - Resume previous diet.                           - Continue present medications.                           You will receive a letter within 2-3 weeks with the                            pathology results and my final recommendations.                           If the polyp(s) is proven to be 'pre-cancerous' on                            pathology, you will need repeat colonoscopy in 5                            years. If the polyp(s) is NOT 'precancerous' on                            pathology then you should repeat colon cancer                            screening in 10 years with colonoscopy without need                            for colon cancer screening by any method prior to                            then (including stool testing). Milus Banister, MD 02/08/2019 8:20:03 AM This report has been signed electronically.

## 2019-02-08 NOTE — Patient Instructions (Signed)
YOU HAD AN ENDOSCOPIC PROCEDURE TODAY AT THE [facility]:   Refer to the procedure report that was given to you for any specific questions about what was found during the examination.  If the procedure report does not answer your questions, please call your gastroenterologist to clarify.  If you requested that your care partner not be given the details of your procedure findings, then the procedure report has been included in a sealed envelope for you to review at your convenience later.  YOU SHOULD EXPECT: Some feelings of bloating in the abdomen. Passage of more gas than usual.  Walking can help get rid of the air that was put into your GI tract during the procedure and reduce the bloating. If you had a lower endoscopy (such as a colonoscopy or flexible sigmoidoscopy) you may notice spotting of blood in your stool or on the toilet paper. If you underwent a bowel prep for your procedure, you may not have a normal bowel movement for a few days.  Please Note:  You might notice some irritation and congestion in your nose or some drainage.  This is from the oxygen used during your procedure.  There is no need for concern and it should clear up in a day or so.  SYMPTOMS TO REPORT IMMEDIATELY:   Following lower endoscopy (colonoscopy or flexible sigmoidoscopy):  Excessive amounts of blood in the stool  Significant tenderness or worsening of abdominal pains  Swelling of the abdomen that is new, acute  Fever of 100F or higher  For urgent or emergent issues, a gastroenterologist can be reached at any hour by calling [phone].   DIET:  We do recommend a small meal at first, but then you may proceed to your regular diet.  Drink plenty of fluids but you should avoid alcoholic beverages for 24 hours.  ACTIVITY:  You should plan to take it easy for the rest of today and you should NOT DRIVE or use heavy machinery until tomorrow (because of the sedation medicines used during the test).     FOLLOW UP: Our staff will call the number listed on your records the next business day following your procedure to check on you and address any questions or concerns that you may have regarding the information given to you following your procedure. If we do not reach you, we will leave a message.  However, if you are feeling well and you are not experiencing any problems, there is no need to return our call.  We will assume that you have returned to your regular daily activities without incident.  If any biopsies were taken you will be contacted by phone or by letter within the next 1-3 weeks.  Please call us at [phone] if you have not heard about the biopsies in 3 weeks.    Await for biopsy results Polyps (handout given) Hemorrhoids (handout given)  SIGNATURES/CONFIDENTIALITY: You and/or your care partner have signed paperwork which will be entered into your electronic medical record.  These signatures attest to the fact that that the information above on your After Visit Summary has been reviewed and is understood.  Full responsibility of the confidentiality of this discharge information lies with you and/or your care-partner. 

## 2019-02-08 NOTE — Telephone Encounter (Signed)
Spoke with pt. Adv pt that his lab work that was done at [facility] has been received and reviewed by Dr.Hilty. Pt made aware of Dr.Hilty's comments.  Notes recorded by Pixie Casino, MD on 02/07/2019 at 9:24 PM EST Triglycerides increased - however, LDL is significantly lower.  Dr. Lemmie Evens  Pt verbalized understanding.

## 2019-02-08 NOTE — Telephone Encounter (Signed)
-----   Message from Pixie Casino, MD sent at 02/07/2019  9:24 PM EST ----- Triglycerides increased - however, LDL is significantly lower.  Dr. Lemmie Evens

## 2019-02-08 NOTE — Progress Notes (Signed)
To PACU, VSS. Report to Rn.tb 

## 2019-02-09 ENCOUNTER — Telehealth: Payer: Self-pay | Admitting: *Deleted

## 2019-02-09 NOTE — Telephone Encounter (Signed)
  Follow up Call-  Call back number 02/08/2019  Post procedure Call Back phone  # [phone] or wife's number [phone]  Permission to leave phone message Yes  Some recent data might be hidden     Patient questions:  Do you have a fever, pain , or abdominal swelling? No. Pain Score  0 *  Have you tolerated food without any problems? Yes.    Have you been able to return to your normal activities? Yes.    Do you have any questions about your discharge instructions: Diet   No. Medications  No. Follow up visit  No.  Do you have questions or concerns about your Care? No.  Actions: * If pain score is 4 or above: No action needed, pain <4.

## 2019-02-11 ENCOUNTER — Encounter: Payer: Self-pay | Admitting: Gastroenterology

## 2019-03-05 ENCOUNTER — Telehealth: Payer: Self-pay | Admitting: Gastroenterology

## 2019-03-05 NOTE — Telephone Encounter (Signed)
The pt has had a change in bowel habits and would like an appt to come in to discuss with Dr Ardis Hughs.  Pt has been scheduled and will call with any further concerns

## 2019-03-05 NOTE — Telephone Encounter (Signed)
Pt wife called wanted to talk with the nurse about results.

## 2019-04-06 ENCOUNTER — Telehealth: Payer: Self-pay

## 2019-04-07 ENCOUNTER — Telehealth: Payer: Self-pay | Admitting: Internal Medicine

## 2019-04-07 NOTE — Telephone Encounter (Signed)
Virtual Visit Pre-Appointment Phone Call  Steps For Call:  1. Confirm consent - "In the setting of the current Covid19 crisis, you are scheduled for a (phone or video) visit with your provider on (date) at (time).  Just as we do with many in-office visits, in order for you to participate in this visit, we must obtain consent.  If you'd like, I can send this to your mychart (if signed up) or email for you to review.  Otherwise, I can obtain your verbal consent now.  All virtual visits are billed to your insurance company just like a normal visit would be.  By agreeing to a virtual visit, we'd like you to understand that the technology does not allow for your provider to perform an examination, and thus may limit your provider's ability to fully assess your condition.  Finally, though the technology is pretty good, we cannot assure that it will always work on either your or our end, and in the setting of a video visit, we may have to convert it to a phone-only visit.  In either situation, we cannot ensure that we have a secure connection.  Are you willing to proceed?" STAFF: Did the patient verbally acknowledge consent to telehealth visit? Document YES/NO here: YES  2. Confirm the BEST phone number to call the day of the visit by including in appointment notes  3. Give patient instructions for WebEx/MyChart download to smartphone as below or Doximity/Doxy.me if video visit (depending on what platform provider is using)  4. Advise patient to be prepared with their blood pressure, heart rate, weight, any heart rhythm information, their current medicines, and a piece of paper and pen handy for any instructions they may receive the day of their visit  5. Inform patient they will receive a phone call 15 minutes prior to their appointment time (may be from unknown caller ID) so they should be prepared to answer  6. Confirm that appointment type is correct in Epic appointment notes (VIDEO vs PHONE)      TELEPHONE CALL NOTE  Riley Grant has been deemed a candidate for a follow-up tele-health visit to limit community exposure during the Covid-19 pandemic. I spoke with the patient via phone to ensure availability of phone/video source, confirm preferred email & phone number, and discuss instructions and expectations.  I reminded Riley Grant to be prepared with any vital sign and/or heart rhythm information that could potentially be obtained via home monitoring, at the time of his visit. I reminded Riley Grant to expect a phone call at the time of his visit if his visit.  Riley Grant, [place], [place] 04/07/2019 10:49 AM   INSTRUCTIONS FOR DOWNLOADING THE WEBEX APP TO SMARTPHONE  - If Apple, ask patient to go to [employer] and type in WebEx in the search bar. [place] [employer], the blue/green circle. If Android, go to [employer] and type in [employer] in the search bar. The app is free but as with any other app downloads, their phone may require them to verify saved payment information or Apple/Android password.  - The patient does NOT have to create an account. - On the day of the visit, the assist will walk the patient through joining the meeting with the meeting number/password.  INSTRUCTIONS FOR DOWNLOADING THE MYCHART APP TO SMARTPHONE  - The patient must first make sure to have activated MyChart and know their login information - If Apple, go to [employer] and type in  MyChart in the search bar and download the app. If Android, ask patient to go to [employer] and type in [place] in the search bar and download the app. The app is free but as with any other app downloads, their phone may require them to verify saved payment information or Apple/Android password.  - The patient will need to then log into the app with their MyChart username and password, and select [facility] as their healthcare provider to link the account. When it is time for your visit, go to the MyChart  app, find appointments, and click Begin Video Visit. Be sure to Select Allow for your device to access the Microphone and Camera for your visit. You will then be connected, and your provider will be with you shortly.  **If they have any issues connecting, or need assistance please contact MyChart service desk [phone])**  **If using a computer, in order to ensure the best quality for their visit they will need to use either of the following Internet Browsers: [employer], or Google Chrome**  IF USING DOXIMITY or DOXY.ME - The patient will receive a link just prior to their visit, either by text or email (to be determined day of appointment depending on if it's doxy.me or Doximity).     FULL LENGTH CONSENT FOR TELE-HEALTH VISIT   I hereby voluntarily request, consent and authorize [facility] and its employed or contracted physicians, physician assistants, nurse practitioners or other licensed health care professionals (the Practitioner), to provide me with telemedicine health care services (the "Services") as deemed necessary by the treating Practitioner. I acknowledge and consent to receive the Services by the Practitioner via telemedicine. I understand that the telemedicine visit will involve communicating with the Practitioner through live audiovisual communication technology and the disclosure of certain medical information by electronic transmission. I acknowledge that I have been given the opportunity to request an in-person assessment or other available alternative prior to the telemedicine visit and am voluntarily participating in the telemedicine visit.  I understand that I have the right to withhold or withdraw my consent to the use of telemedicine in the course of my care at any time, without affecting my right to future care or treatment, and that the Practitioner or I may terminate the telemedicine visit at any time. I understand that I have the right to inspect all  information obtained and/or recorded in the course of the telemedicine visit and may receive copies of available information for a reasonable fee.  I understand that some of the potential risks of receiving the Services via telemedicine include:  [place] Delay or interruption in medical evaluation due to technological equipment failure or disruption; . Information transmitted may not be sufficient (e.g. poor resolution of images) to allow for appropriate medical decision making by the Practitioner; and/or  . In rare instances, security protocols could fail, causing a breach of personal health information.  Furthermore, I acknowledge that it is my responsibility to provide information about my medical history, conditions and care that is complete and accurate to the best of my ability. I acknowledge that Practitioner's advice, recommendations, and/or decision may be based on factors not within their control, such as incomplete or inaccurate data provided by me or distortions of diagnostic images or specimens that may result from electronic transmissions. I understand that the practice of medicine is not an exact science and that Practitioner makes no warranties or guarantees regarding treatment outcomes. I acknowledge that I will receive a copy  of this consent concurrently upon execution via email to the email address I last provided but may also request a printed copy by calling the office of [facility].    I understand that my insurance will be billed for this visit.   I have read or had this consent read to me. . I understand the contents of this consent, which adequately explains the benefits and risks of the Services being provided via telemedicine.  . I have been provided ample opportunity to ask questions regarding this consent and the Services and have had my questions answered to my satisfaction. . I give my informed consent for the services to be provided through the use of telemedicine in my  medical care  By participating in this telemedicine visit I agree to the above.

## 2019-04-07 NOTE — Telephone Encounter (Signed)
Smartphone/ virtual consent/ my chart via text/ pre reg completed °

## 2019-04-09 ENCOUNTER — Other Ambulatory Visit: Payer: Self-pay

## 2019-04-09 ENCOUNTER — Telehealth (INDEPENDENT_AMBULATORY_CARE_PROVIDER_SITE_OTHER): Payer: BLUE CROSS/BLUE SHIELD | Admitting: Internal Medicine

## 2019-04-09 ENCOUNTER — Encounter: Payer: Self-pay | Admitting: Internal Medicine

## 2019-04-09 VITALS — Ht 68.0 in | Wt 248.0 lb

## 2019-04-09 DIAGNOSIS — E119 Type 2 diabetes mellitus without complications: Secondary | ICD-10-CM | POA: Diagnosis not present

## 2019-04-09 DIAGNOSIS — Z7189 Other specified counseling: Secondary | ICD-10-CM

## 2019-04-09 DIAGNOSIS — E785 Hyperlipidemia, unspecified: Secondary | ICD-10-CM

## 2019-04-09 DIAGNOSIS — I25119 Atherosclerotic heart disease of native coronary artery with unspecified angina pectoris: Secondary | ICD-10-CM

## 2019-04-09 DIAGNOSIS — I10 Essential (primary) hypertension: Secondary | ICD-10-CM

## 2019-04-09 NOTE — Patient Instructions (Signed)
Medication Instructions:  Continue same medications If you need a refill on your cardiac medications before your next appointment, please call your pharmacy.   Lab work: None ordered   Testing/Procedures: None ordered  Follow-Up: At [facility], you and your health needs are our priority.  As part of our continuing mission to provide you with exceptional heart care, we have created designated Provider Care Teams.  These Care Teams include your primary Cardiologist (physician) and Advanced Practice Providers (APPs -  Physician Assistants and Nurse Practitioners) who all work together to provide you with the care you need, when you need it. . Schedule follow up appointment with Dr.Hilty in 6 months   Call 3 months before to schedule   You will need to have fasting Lipid panel done 3 to 5 days before appointment  lab slip enclosed.
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
## 2019-04-12 ENCOUNTER — Emergency Department (HOSPITAL_COMMUNITY): Payer: BLUE CROSS/BLUE SHIELD

## 2019-04-12 ENCOUNTER — Encounter: Payer: Self-pay | Admitting: Gastroenterology

## 2019-04-12 ENCOUNTER — Observation Stay (HOSPITAL_COMMUNITY)
Admission: EM | Admit: 2019-04-12 | Discharge: 2019-04-12 | Disposition: A | Discharge status: Home / Self Care | Payer: BLUE CROSS/BLUE SHIELD | Attending: Emergency Medicine | Admitting: Emergency Medicine

## 2019-04-12 ENCOUNTER — Ambulatory Visit (INDEPENDENT_AMBULATORY_CARE_PROVIDER_SITE_OTHER): Payer: BLUE CROSS/BLUE SHIELD | Admitting: Gastroenterology

## 2019-04-12 ENCOUNTER — Encounter (HOSPITAL_COMMUNITY): Payer: Self-pay | Admitting: Emergency Medicine

## 2019-04-12 ENCOUNTER — Other Ambulatory Visit: Payer: Self-pay

## 2019-04-12 VITALS — BP 142/90 | HR 85 | Ht 68.0 in | Wt 245.0 lb

## 2019-04-12 DIAGNOSIS — E785 Hyperlipidemia, unspecified: Secondary | ICD-10-CM | POA: Insufficient documentation

## 2019-04-12 DIAGNOSIS — I252 Old myocardial infarction: Secondary | ICD-10-CM | POA: Diagnosis not present

## 2019-04-12 DIAGNOSIS — Z79899 Other long term (current) drug therapy: Secondary | ICD-10-CM | POA: Insufficient documentation

## 2019-04-12 DIAGNOSIS — Z7982 Long term (current) use of aspirin: Secondary | ICD-10-CM | POA: Diagnosis not present

## 2019-04-12 DIAGNOSIS — Z955 Presence of coronary angioplasty implant and graft: Secondary | ICD-10-CM | POA: Diagnosis not present

## 2019-04-12 DIAGNOSIS — R072 Precordial pain: Secondary | ICD-10-CM

## 2019-04-12 DIAGNOSIS — E119 Type 2 diabetes mellitus without complications: Secondary | ICD-10-CM | POA: Diagnosis not present

## 2019-04-12 DIAGNOSIS — I10 Essential (primary) hypertension: Secondary | ICD-10-CM | POA: Diagnosis not present

## 2019-04-12 DIAGNOSIS — Z8673 Personal history of transient ischemic attack (TIA), and cerebral infarction without residual deficits: Secondary | ICD-10-CM | POA: Insufficient documentation

## 2019-04-12 DIAGNOSIS — Z8249 Family history of ischemic heart disease and other diseases of the circulatory system: Secondary | ICD-10-CM | POA: Diagnosis not present

## 2019-04-12 DIAGNOSIS — Z823 Family history of stroke: Secondary | ICD-10-CM | POA: Diagnosis not present

## 2019-04-12 DIAGNOSIS — E7800 Pure hypercholesterolemia, unspecified: Secondary | ICD-10-CM | POA: Diagnosis not present

## 2019-04-12 DIAGNOSIS — G4733 Obstructive sleep apnea (adult) (pediatric): Secondary | ICD-10-CM | POA: Insufficient documentation

## 2019-04-12 DIAGNOSIS — R079 Chest pain, unspecified: Secondary | ICD-10-CM

## 2019-04-12 DIAGNOSIS — I25119 Atherosclerotic heart disease of native coronary artery with unspecified angina pectoris: Principal | ICD-10-CM | POA: Insufficient documentation

## 2019-04-12 DIAGNOSIS — K625 Hemorrhage of anus and rectum: Secondary | ICD-10-CM

## 2019-04-12 DIAGNOSIS — K219 Gastro-esophageal reflux disease without esophagitis: Secondary | ICD-10-CM | POA: Insufficient documentation

## 2019-04-12 DIAGNOSIS — Z888 Allergy status to other drugs, medicaments and biological substances status: Secondary | ICD-10-CM | POA: Insufficient documentation

## 2019-04-12 DIAGNOSIS — F319 Bipolar disorder, unspecified: Secondary | ICD-10-CM | POA: Insufficient documentation

## 2019-04-12 DIAGNOSIS — Z7984 Long term (current) use of oral hypoglycemic drugs: Secondary | ICD-10-CM | POA: Diagnosis not present

## 2019-04-12 DIAGNOSIS — Z96651 Presence of right artificial knee joint: Secondary | ICD-10-CM | POA: Insufficient documentation

## 2019-04-12 HISTORY — DX: Chest pain, unspecified: R07.9

## 2019-04-12 LAB — TROPONIN I
Troponin I: <0.03 ng/mL (ref <0.03)
Troponin I: <0.03 ng/mL (ref <0.03)

## 2019-04-12 LAB — CBC WITH DIFFERENTIAL/PLATELET
Abs Immature Granulocytes: 0.02 10*3/uL (ref 0.00–0.07)
Basophils Absolute: 0 10*3/uL (ref 0.0–0.1)
Basophils Relative: 1 %
Eosinophils Absolute: 0.1 10*3/uL (ref 0.0–0.5)
Eosinophils Relative: 2 %
HCT: 42.7 % (ref 39.0–52.0)
Hemoglobin: 14.9 g/dL (ref 13.0–17.0)
Immature Granulocytes: 0 %
Lymphocytes Relative: 45 %
Lymphs Abs: 2.4 10*3/uL (ref 0.7–4.0)
MCH: 29.7 pg (ref 26.0–34.0)
MCHC: 34.9 g/dL (ref 30.0–36.0)
MCV: 85.1 fL (ref 80.0–100.0)
Monocytes Absolute: 0.5 10*3/uL (ref 0.1–1.0)
Monocytes Relative: 10 %
Neutro Abs: 2.2 10*3/uL (ref 1.7–7.7)
Neutrophils Relative %: 42 %
Platelets: 279 10*3/uL (ref 150–400)
RBC: 5.02 MIL/uL (ref 4.22–5.81)
RDW: 13.2 % (ref 11.5–15.5)
WBC: 5.4 10*3/uL (ref 4.0–10.5)
nRBC: 0 % (ref 0.0–0.2)

## 2019-04-12 LAB — COMPREHENSIVE METABOLIC PANEL
ALT: 37 U/L (ref 0–44)
AST: 26 U/L (ref 15–41)
Albumin: 3.8 g/dL (ref 3.5–5.0)
Alkaline Phosphatase: 58 U/L (ref 38–126)
Anion gap: 8 (ref 5–15)
BUN: 13 mg/dL (ref 6–20)
CO2: 23 mmol/L (ref 22–32)
Calcium: 9.2 mg/dL (ref 8.9–10.3)
Chloride: 104 mmol/L (ref 98–111)
Creatinine, Ser: 1.04 mg/dL (ref 0.61–1.24)
GFR calc Af Amer: >60 mL/min (ref >60)
GFR calc non Af Amer: >60 mL/min (ref >60)
Glucose, Bld: 129 mg/dL — ABNORMAL HIGH (ref 70–99)
Potassium: 3.7 mmol/L (ref 3.5–5.1)
Sodium: 135 mmol/L (ref 135–145)
Total Bilirubin: 1 mg/dL (ref 0.3–1.2)
Total Protein: 7 g/dL (ref 6.5–8.1)

## 2019-04-12 MED ORDER — SODIUM CHLORIDE 0.9% FLUSH: 3.0000 mL | Freq: Once | INTRAVENOUS | Status: DC

## 2019-04-12 MED ORDER — ASPIRIN 81 MG PO CHEW
324.0000 mg | CHEWABLE_TABLET | Freq: Once | ORAL | Status: AC
  Administered 2019-04-12: 14:00:00 243 mg via ORAL
  Filled 2019-04-12: qty 4
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
## 2019-04-12 NOTE — ED Triage Notes (Signed)
Onset today shortness of breath, chest pain radiating to left arm tingling then right arm. States also had an upset stomach.

## 2019-04-12 NOTE — Progress Notes (Signed)
This telemedicine visit was unable to happen because the patient was in the emergency room with chest pain at the time of the visit.  I told his wife to have him call the office to reschedule it at his soonest convenience.

## 2019-04-12 NOTE — Discharge Instructions (Signed)
YOUR CARDIOLOGY TEAM HAS ARRANGED FOR AN E-VISIT FOR YOUR APPOINTMENT - PLEASE REVIEW IMPORTANT INFORMATION BELOW SEVERAL DAYS PRIOR TO YOUR APPOINTMENT  Due to the recent COVID-19 pandemic, we are transitioning in-person office visits to tele-medicine visits in an effort to decrease unnecessary exposure to our patients, their families, and staff. These visits are billed to your insurance just like a normal visit is. We also encourage you to sign up for MyChart if you have not already done so. You will need a smartphone if possible. For patients that do not have this, we can still complete the visit using a regular telephone but do prefer a smartphone to enable video when possible. You may have a family member that lives with you that can help. If possible, we also ask that you have a blood pressure cuff and scale at home to measure your blood pressure, heart rate and weight prior to your scheduled appointment. Patients with clinical needs that need an in-person evaluation and testing will still be able to come to the office if absolutely necessary. If you have any questions, feel free to call our office.   YOUR PROVIDER WILL BE USING ONE OF THE FOLLOWING PLATFORMS TO COMPLETE YOUR VISIT: You will get a phone call prior to the visit for Nurse to provided instructions.   We advise signing up for MyChart for best communication.    2-3 DAYS BEFORE YOUR APPOINTMENT  You will receive a telephone call from one of our [place] team members - your caller ID may say "Unknown caller." If this is a video visit, we will walk you through how to get the video launched on your phone. We will remind you check your blood pressure, heart rate and weight prior to your scheduled appointment. If you have an Apple Watch or Kardia, please upload any pertinent ECG strips the day before or morning of your appointment to [facility]. Our staff will also make sure you have reviewed the consent and agree to move forward with your  scheduled tele-health visit.     THE DAY OF YOUR APPOINTMENT  Approximately 15 minutes prior to your scheduled appointment, you will receive a telephone call from one of [facility] team - your caller ID may say "Unknown caller."  Our staff will confirm medications, vital signs for the day and any symptoms you may be experiencing. Please have this information available prior to the time of visit start. It may also be helpful for you to have a pad of paper and pen handy for any instructions given during your visit. They will also walk you through joining the smartphone meeting if this is a video visit.    CONSENT FOR TELE-HEALTH VISIT - PLEASE REVIEW  I hereby voluntarily request, consent and authorize [facility] and its employed or contracted physicians, physician assistants, nurse practitioners or other licensed health care professionals (the Practitioner), to provide me with telemedicine health care services (the Services") as deemed necessary by the treating Practitioner. I acknowledge and consent to receive the Services by the Practitioner via telemedicine. I understand that the telemedicine visit will involve communicating with the Practitioner through live audiovisual communication technology and the disclosure of certain medical information by electronic transmission. I acknowledge that I have been given the opportunity to request an in-person assessment or other available alternative prior to the telemedicine visit and am voluntarily participating in the telemedicine visit.  I understand that I have the right to withhold or withdraw my consent to the use of telemedicine in  the course of my care at any time, without affecting my right to future care or treatment, and that the Practitioner or I may terminate the telemedicine visit at any time. I understand that I have the right to inspect all information obtained and/or recorded in the course of the telemedicine visit and may receive copies of  available information for a reasonable fee.  I understand that some of the potential risks of receiving the Services via telemedicine include:   Delay or interruption in medical evaluation due to technological equipment failure or disruption;  Information transmitted may not be sufficient (e.g. poor resolution of images) to allow for appropriate medical decision making by the Practitioner; and/or   In rare instances, security protocols could fail, causing a breach of personal health information.  Furthermore, I acknowledge that it is my responsibility to provide information about my medical history, conditions and care that is complete and accurate to the best of my ability. I acknowledge that Practitioner's advice, recommendations, and/or decision may be based on factors not within their control, such as incomplete or inaccurate data provided by me or distortions of diagnostic images or specimens that may result from electronic transmissions. I understand that the practice of medicine is not an exact science and that Practitioner makes no warranties or guarantees regarding treatment outcomes. I acknowledge that I will receive a copy of this consent concurrently upon execution via email to the email address I last provided but may also request a printed copy by calling the office of [facility].    I understand that my insurance will be billed for this visit.   I have read or had this consent read to me.  I understand the contents of this consent, which adequately explains the benefits and risks of the Services being provided via telemedicine.   I have been provided ample opportunity to ask questions regarding this consent and the Services and have had my questions answered to my satisfaction.  I give my informed consent for the services to be provided through the use of telemedicine in my medical care  By participating in this telemedicine visit I agree to the above.

## 2019-04-12 NOTE — Progress Notes (Signed)
.    1 week follow up virtual visit arranged for 04/21/2019. Instructions for visit added to AVS.   Daune Perch, AGNP-C [facility] 04/12/2019  4:56 PM
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
## 2019-04-12 NOTE — ED Notes (Signed)
ED Provider at bedside. 

## 2019-04-12 NOTE — ED Provider Notes (Signed)
48 year old male with hx of CAD presents with chest pain. Work up has been reassuring. Cardiology is coming to see pt. Will dispo pending their eval.  Cards recommends delta trop. If rising, admit. If negative, f/u as outpatient.  2nd trop is negative. Discussed with pt who is comfortable with plan and does not have any further questions. Will d/c   Recardo Evangelist, PA-C 04/12/19 1840    Maudie Flakes, MD 04/13/19 [phone]

## 2019-04-13 ENCOUNTER — Ambulatory Visit: Payer: Self-pay | Admitting: Gastroenterology

## 2019-04-20 ENCOUNTER — Telehealth: Payer: Self-pay | Admitting: *Deleted

## 2019-04-20 NOTE — Telephone Encounter (Signed)
Unable to leave a message,mailbox is full. 

## 2019-04-21 ENCOUNTER — Telehealth: Payer: BLUE CROSS/BLUE SHIELD | Admitting: Physician Assistant

## 2019-04-21 NOTE — Telephone Encounter (Signed)
04/21/19 Unable to leave a message, mailbox is full.

## 2019-07-27 ENCOUNTER — Ambulatory Visit: Payer: BLUE CROSS/BLUE SHIELD | Admitting: Gastroenterology

## 2019-08-15 ENCOUNTER — Encounter (HOSPITAL_COMMUNITY): Payer: Self-pay | Admitting: Oncology

## 2019-08-15 ENCOUNTER — Emergency Department (HOSPITAL_COMMUNITY)
Admission: EM | Admit: 2019-08-15 | Discharge: 2019-08-16 | Disposition: A | Discharge status: Home / Self Care | Payer: BC Managed Care – PPO | Attending: Emergency Medicine | Admitting: Emergency Medicine

## 2019-08-15 ENCOUNTER — Other Ambulatory Visit: Payer: Self-pay

## 2019-08-15 DIAGNOSIS — R51 Headache: Secondary | ICD-10-CM | POA: Diagnosis not present

## 2019-08-15 DIAGNOSIS — E119 Type 2 diabetes mellitus without complications: Secondary | ICD-10-CM | POA: Diagnosis not present

## 2019-08-15 DIAGNOSIS — Z8673 Personal history of transient ischemic attack (TIA), and cerebral infarction without residual deficits: Secondary | ICD-10-CM | POA: Insufficient documentation

## 2019-08-15 DIAGNOSIS — R0789 Other chest pain: Secondary | ICD-10-CM | POA: Insufficient documentation

## 2019-08-15 DIAGNOSIS — I2510 Atherosclerotic heart disease of native coronary artery without angina pectoris: Secondary | ICD-10-CM | POA: Diagnosis not present

## 2019-08-15 DIAGNOSIS — R079 Chest pain, unspecified: Secondary | ICD-10-CM

## 2019-08-15 DIAGNOSIS — I10 Essential (primary) hypertension: Secondary | ICD-10-CM | POA: Diagnosis not present

## 2019-08-15 NOTE — ED Notes (Signed)
[place] pts wife call with any updates regarding pt

## 2019-08-15 NOTE — ED Triage Notes (Signed)
Pt bib [facility] d/t left sided CP that started at approximately 2230.  Pt states he was laying in bed.  +nausea, +diaphoresis, +shob.  Pt took 324 asa and 1 nitro tablet prior to EMS arrival.  Per EMS pt was in a. Fib rvr.  Pt w/ significant cardiac hx.

## 2019-08-16 ENCOUNTER — Emergency Department (HOSPITAL_COMMUNITY): Payer: BC Managed Care – PPO

## 2019-08-16 LAB — CBC WITH DIFFERENTIAL/PLATELET
Abs Immature Granulocytes: 0.01 10*3/uL (ref 0.00–0.07)
Basophils Absolute: 0 10*3/uL (ref 0.0–0.1)
Basophils Relative: 1 %
Eosinophils Absolute: 0.2 10*3/uL (ref 0.0–0.5)
Eosinophils Relative: 3 %
HCT: 46.6 % (ref 39.0–52.0)
Hemoglobin: 15.8 g/dL (ref 13.0–17.0)
Immature Granulocytes: 0 %
Lymphocytes Relative: 38 %
Lymphs Abs: 2.4 10*3/uL (ref 0.7–4.0)
MCH: 29.4 pg (ref 26.0–34.0)
MCHC: 33.9 g/dL (ref 30.0–36.0)
MCV: 86.6 fL (ref 80.0–100.0)
Monocytes Absolute: 0.6 10*3/uL (ref 0.1–1.0)
Monocytes Relative: 10 %
Neutro Abs: 3 10*3/uL (ref 1.7–7.7)
Neutrophils Relative %: 48 %
Platelets: 336 10*3/uL (ref 150–400)
RBC: 5.38 MIL/uL (ref 4.22–5.81)
RDW: 12.3 % (ref 11.5–15.5)
WBC: 6.3 10*3/uL (ref 4.0–10.5)
nRBC: 0 % (ref 0.0–0.2)

## 2019-08-16 LAB — COMPREHENSIVE METABOLIC PANEL
ALT: 58 U/L — ABNORMAL HIGH (ref 0–44)
AST: 32 U/L (ref 15–41)
Albumin: 3.7 g/dL (ref 3.5–5.0)
Alkaline Phosphatase: 64 U/L (ref 38–126)
Anion gap: 11 (ref 5–15)
BUN: 15 mg/dL (ref 6–20)
CO2: 21 mmol/L — ABNORMAL LOW (ref 22–32)
Calcium: 9 mg/dL (ref 8.9–10.3)
Chloride: 106 mmol/L (ref 98–111)
Creatinine, Ser: 0.88 mg/dL (ref 0.61–1.24)
GFR calc Af Amer: >60 mL/min (ref >60)
GFR calc non Af Amer: >60 mL/min (ref >60)
Glucose, Bld: 211 mg/dL — ABNORMAL HIGH (ref 70–99)
Potassium: 3.9 mmol/L (ref 3.5–5.1)
Sodium: 138 mmol/L (ref 135–145)
Total Bilirubin: 0.7 mg/dL (ref 0.3–1.2)
Total Protein: 6.9 g/dL (ref 6.5–8.1)

## 2019-08-16 LAB — TROPONIN I (HIGH SENSITIVITY)
Troponin I (High Sensitivity): 5 ng/L (ref <18)
Troponin I (High Sensitivity): 6 ng/L (ref <18)

## 2019-08-16 LAB — LIPASE, BLOOD: Lipase: 35 U/L (ref 11–51)
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
## 2019-08-16 NOTE — ED Notes (Signed)
Delay in lab draw,  Pt not in room 

## 2019-09-09 DIAGNOSIS — I2510 Atherosclerotic heart disease of native coronary artery without angina pectoris: Secondary | ICD-10-CM | POA: Insufficient documentation

## 2019-09-09 DIAGNOSIS — Z7982 Long term (current) use of aspirin: Secondary | ICD-10-CM

## 2019-09-09 DIAGNOSIS — E782 Mixed hyperlipidemia: Secondary | ICD-10-CM | POA: Insufficient documentation

## 2019-09-09 HISTORY — DX: Atherosclerotic heart disease of native coronary artery without angina pectoris: I25.10

## 2019-09-09 HISTORY — DX: Mixed hyperlipidemia: E78.2

## 2019-09-09 HISTORY — DX: Long term (current) use of aspirin: Z79.82

## 2019-09-16 DIAGNOSIS — G4733 Obstructive sleep apnea (adult) (pediatric): Secondary | ICD-10-CM

## 2019-09-16 HISTORY — DX: Obstructive sleep apnea (adult) (pediatric): G47.33

## 2019-09-21 ENCOUNTER — Ambulatory Visit (INDEPENDENT_AMBULATORY_CARE_PROVIDER_SITE_OTHER): Payer: BC Managed Care – PPO | Admitting: Gastroenterology

## 2019-09-21 VITALS — BP 122/80 | HR 78 | Ht 68.0 in | Wt 251.0 lb

## 2019-09-21 DIAGNOSIS — K5900 Constipation, unspecified: Secondary | ICD-10-CM | POA: Diagnosis not present
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
## 2019-09-21 NOTE — Patient Instructions (Signed)
Please start taking Citrucel(orange flavored) powder supplement.This may cause some bloating at first,but that usually goes away.Begin with a small spoonful and work your way up to a large,heaping spoonful daily over a week  Please call us in 6 weeks to let us know how you are doing  Thank you for entrusting me with your care and choosing [facility].  Dr Ardis Hughs

## 2019-09-23 DIAGNOSIS — Z789 Other specified health status: Secondary | ICD-10-CM | POA: Insufficient documentation

## 2019-09-23 HISTORY — DX: Other specified health status: Z78.9

## 2019-11-16 NOTE — Telephone Encounter (Signed)
Attempted to call patient in reference to message sent via mychart. VM is not set up.

## 2019-11-24 ENCOUNTER — Other Ambulatory Visit: Payer: Self-pay | Admitting: Internal Medicine

## 2020-03-15 ENCOUNTER — Other Ambulatory Visit: Payer: Self-pay | Admitting: Internal Medicine

## 2020-04-19 ENCOUNTER — Other Ambulatory Visit: Payer: Self-pay | Admitting: Internal Medicine

## 2020-05-04 DIAGNOSIS — M4306 Spondylolysis, lumbar region: Secondary | ICD-10-CM | POA: Insufficient documentation

## 2020-05-04 HISTORY — DX: Spondylolysis, lumbar region: M43.06

## 2020-05-10 ENCOUNTER — Other Ambulatory Visit: Payer: Self-pay | Admitting: Internal Medicine

## 2020-05-31 ENCOUNTER — Encounter: Payer: Self-pay | Admitting: General Practice

## 2020-06-09 ENCOUNTER — Other Ambulatory Visit: Payer: Self-pay | Admitting: Internal Medicine

## 2020-06-27 ENCOUNTER — Other Ambulatory Visit: Payer: Self-pay | Admitting: Internal Medicine

## 2020-07-13 DIAGNOSIS — Z981 Arthrodesis status: Secondary | ICD-10-CM

## 2020-07-13 HISTORY — DX: Arthrodesis status: Z98.1

## 2020-10-24 ENCOUNTER — Other Ambulatory Visit: Payer: Self-pay

## 2020-10-24 DIAGNOSIS — E119 Type 2 diabetes mellitus without complications: Secondary | ICD-10-CM | POA: Insufficient documentation

## 2020-10-24 HISTORY — DX: Type 2 diabetes mellitus without complications: E11.9

## 2020-10-24 HISTORY — DX: Morbid (severe) obesity due to excess calories: E66.01

## 2020-10-26 ENCOUNTER — Ambulatory Visit: Payer: BLUE CROSS/BLUE SHIELD | Admitting: Cardiology

## 2020-11-14 ENCOUNTER — Other Ambulatory Visit: Payer: Self-pay

## 2020-11-14 ENCOUNTER — Ambulatory Visit (INDEPENDENT_AMBULATORY_CARE_PROVIDER_SITE_OTHER): Payer: BLUE CROSS/BLUE SHIELD

## 2020-11-14 ENCOUNTER — Encounter: Payer: Self-pay | Admitting: Cardiology

## 2020-11-14 ENCOUNTER — Ambulatory Visit: Payer: BLUE CROSS/BLUE SHIELD | Admitting: Cardiology

## 2020-11-14 VITALS — BP 120/80 | HR 74 | Ht 68.0 in | Wt 252.0 lb

## 2020-11-14 DIAGNOSIS — R002 Palpitations: Secondary | ICD-10-CM

## 2020-11-14 DIAGNOSIS — I2510 Atherosclerotic heart disease of native coronary artery without angina pectoris: Secondary | ICD-10-CM | POA: Diagnosis not present

## 2020-11-14 DIAGNOSIS — E118 Type 2 diabetes mellitus with unspecified complications: Secondary | ICD-10-CM

## 2020-11-14 DIAGNOSIS — R000 Tachycardia, unspecified: Secondary | ICD-10-CM

## 2020-11-14 DIAGNOSIS — I255 Ischemic cardiomyopathy: Secondary | ICD-10-CM | POA: Insufficient documentation

## 2020-11-14 DIAGNOSIS — Z794 Long term (current) use of insulin: Secondary | ICD-10-CM

## 2020-11-14 DIAGNOSIS — I10 Essential (primary) hypertension: Secondary | ICD-10-CM | POA: Insufficient documentation

## 2020-11-14 DIAGNOSIS — R0602 Shortness of breath: Secondary | ICD-10-CM

## 2020-11-14 NOTE — Patient Instructions (Signed)
Medication Instructions:  Your physician recommends that you continue on your current medications as directed. Please refer to the Current Medication list given to you today.  *If you need a refill on your cardiac medications before your next appointment, please call your pharmacy*   Lab Work: None If you have labs (blood work) drawn today and your tests are completely normal, you will receive your results only by: Marland Kitchen MyChart Message (if you have MyChart) OR . A paper copy in the mail If you have any lab test that is abnormal or we need to change your treatment, we will call you to review the results.   Testing/Procedures: Your physician has requested that you have an echocardiogram. Echocardiography is a painless test that uses sound waves to create images of your heart. It provides your doctor with information about the size and shape of your heart and how well your heart's chambers and valves are working. This procedure takes approximately one hour. There are no restrictions for this procedure.  A zio monitor was ordered today. It will remain on for 14 days. You will then return monitor and event diary in provided box. It takes 1-2 weeks for report to be downloaded and returned to [place]. We will call you with the results. If monitor falls off or has orange flashing light, please call Zio for further instructions.      Follow-Up: At [facility], you and your health needs are our priority.  As part of our continuing mission to provide you with exceptional heart care, we have created designated Provider Care Teams.  These Care Teams include your primary Cardiologist (physician) and Advanced Practice Providers (APPs -  Physician Assistants and Nurse Practitioners) who all work together to provide you with the care you need, when you need it.  We recommend signing up for the patient portal called "MyChart".  Sign up information is provided on this After Visit Summary.  MyChart is used to  connect with patients for Virtual Visits (Telemedicine).  Patients are able to view lab/test results, encounter notes, upcoming appointments, etc.  Non-urgent messages can be sent to your provider as well.   To learn more about what you can do with MyChart, go to [email or web address].    Your next appointment:   3 month(s)  The format for your next appointment:   In Person  Provider:   Berniece Salines, DO   Other Instructions
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
## 2020-12-12 ENCOUNTER — Ambulatory Visit (INDEPENDENT_AMBULATORY_CARE_PROVIDER_SITE_OTHER): Payer: BLUE CROSS/BLUE SHIELD

## 2020-12-12 ENCOUNTER — Other Ambulatory Visit: Payer: Self-pay

## 2020-12-12 DIAGNOSIS — I255 Ischemic cardiomyopathy: Secondary | ICD-10-CM

## 2020-12-12 LAB — ECHOCARDIOGRAM COMPLETE
Area-P 1/2: 4.15 cm2
S' Lateral: 2.7 cm

## 2020-12-12 NOTE — Progress Notes (Signed)
Complete echocardiogram has been performed.  Jimmy Ritvik Mczeal RDCS, RVT 

## 2020-12-13 ENCOUNTER — Telehealth: Payer: Self-pay

## 2020-12-13 NOTE — Telephone Encounter (Signed)
-----   Message from Berniece Salines, DO sent at 12/13/2020 12:03 PM EST ----- Your ejection fraction is normal however your left ventricular walls are thickened which is seen secondary to hypertension.

## 2020-12-13 NOTE — Telephone Encounter (Signed)
Patient notified of test results however has elected to see Dr. Harriet Masson in person to discuss results and discuss treatment plan. Appt arranged on 12/27/20 at 8:20

## 2020-12-26 DIAGNOSIS — F319 Bipolar disorder, unspecified: Secondary | ICD-10-CM | POA: Insufficient documentation

## 2020-12-26 DIAGNOSIS — E119 Type 2 diabetes mellitus without complications: Secondary | ICD-10-CM | POA: Insufficient documentation

## 2020-12-26 DIAGNOSIS — K219 Gastro-esophageal reflux disease without esophagitis: Secondary | ICD-10-CM | POA: Insufficient documentation

## 2020-12-26 DIAGNOSIS — I10 Essential (primary) hypertension: Secondary | ICD-10-CM | POA: Insufficient documentation

## 2020-12-26 DIAGNOSIS — T8859XA Other complications of anesthesia, initial encounter: Secondary | ICD-10-CM | POA: Insufficient documentation

## 2020-12-26 DIAGNOSIS — M1990 Unspecified osteoarthritis, unspecified site: Secondary | ICD-10-CM | POA: Insufficient documentation

## 2020-12-26 DIAGNOSIS — G4730 Sleep apnea, unspecified: Secondary | ICD-10-CM | POA: Insufficient documentation

## 2020-12-26 DIAGNOSIS — F40240 Claustrophobia: Secondary | ICD-10-CM | POA: Insufficient documentation

## 2020-12-26 DIAGNOSIS — E7800 Pure hypercholesterolemia, unspecified: Secondary | ICD-10-CM | POA: Insufficient documentation

## 2020-12-26 DIAGNOSIS — Z8489 Family history of other specified conditions: Secondary | ICD-10-CM | POA: Insufficient documentation

## 2020-12-26 DIAGNOSIS — I219 Acute myocardial infarction, unspecified: Secondary | ICD-10-CM | POA: Insufficient documentation

## 2020-12-26 DIAGNOSIS — K5900 Constipation, unspecified: Secondary | ICD-10-CM | POA: Insufficient documentation

## 2020-12-26 DIAGNOSIS — T7840XA Allergy, unspecified, initial encounter: Secondary | ICD-10-CM | POA: Insufficient documentation

## 2020-12-26 DIAGNOSIS — R911 Solitary pulmonary nodule: Secondary | ICD-10-CM | POA: Insufficient documentation

## 2020-12-26 DIAGNOSIS — T754XXA Electrocution, initial encounter: Secondary | ICD-10-CM | POA: Insufficient documentation

## 2020-12-26 DIAGNOSIS — I209 Angina pectoris, unspecified: Secondary | ICD-10-CM | POA: Insufficient documentation

## 2020-12-26 DIAGNOSIS — F419 Anxiety disorder, unspecified: Secondary | ICD-10-CM | POA: Insufficient documentation

## 2020-12-26 DIAGNOSIS — G459 Transient cerebral ischemic attack, unspecified: Secondary | ICD-10-CM | POA: Insufficient documentation

## 2020-12-26 DIAGNOSIS — F32A Depression, unspecified: Secondary | ICD-10-CM | POA: Insufficient documentation

## 2020-12-27 ENCOUNTER — Other Ambulatory Visit: Payer: Self-pay

## 2020-12-27 ENCOUNTER — Ambulatory Visit: Payer: BLUE CROSS/BLUE SHIELD | Admitting: Cardiology

## 2020-12-27 ENCOUNTER — Encounter: Payer: Self-pay | Admitting: Cardiology

## 2020-12-27 VITALS — BP 132/74 | HR 74 | Ht 68.0 in | Wt 256.4 lb

## 2020-12-27 DIAGNOSIS — I10 Essential (primary) hypertension: Secondary | ICD-10-CM

## 2020-12-27 DIAGNOSIS — E669 Obesity, unspecified: Secondary | ICD-10-CM

## 2020-12-27 DIAGNOSIS — I255 Ischemic cardiomyopathy: Secondary | ICD-10-CM | POA: Diagnosis not present

## 2020-12-27 DIAGNOSIS — E118 Type 2 diabetes mellitus with unspecified complications: Secondary | ICD-10-CM

## 2020-12-27 DIAGNOSIS — I2510 Atherosclerotic heart disease of native coronary artery without angina pectoris: Secondary | ICD-10-CM

## 2020-12-27 DIAGNOSIS — Z794 Long term (current) use of insulin: Secondary | ICD-10-CM

## 2020-12-27 DIAGNOSIS — E782 Mixed hyperlipidemia: Secondary | ICD-10-CM

## 2020-12-27 NOTE — Patient Instructions (Signed)
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
## 2021-01-03 ENCOUNTER — Ambulatory Visit: Payer: BLUE CROSS/BLUE SHIELD | Admitting: Cardiology

## 2021-02-15 ENCOUNTER — Ambulatory Visit: Payer: BLUE CROSS/BLUE SHIELD | Admitting: Cardiology

## 2021-03-29 ENCOUNTER — Telehealth: Payer: Self-pay

## 2021-03-29 NOTE — Telephone Encounter (Signed)
Spoke with patient about lab results and diet modifications recommended by Dr. Harriet Masson. Patient verbalizes understanding. No questions or concerns expressed at this time.

## 2021-06-03 IMAGING — CT CT HEAD WITHOUT CONTRAST
4 series · 15 of 47 positions shown, 17 images · non-contrast
Comparison: Head CT 04/02/2016

CLINICAL DATA: Headache with chest pain and shortness of breath

EXAM:
CT HEAD WITHOUT CONTRAST
TECHNIQUE: Contiguous axial images were obtained from the base of the skull
through the vertex without intravenous contrast.

[Series 3: head without · axial · non-contrast · 0.49mm/px · z∈[-82,+38]mm · 7 of 34 slices shown, 9 images]
[im 5/34  brain]
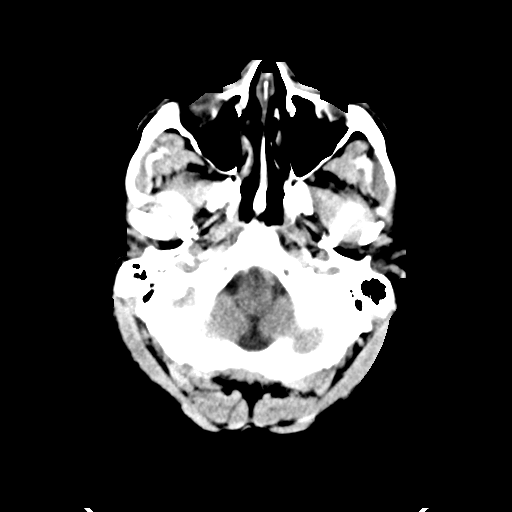
[im 5/34  bone]
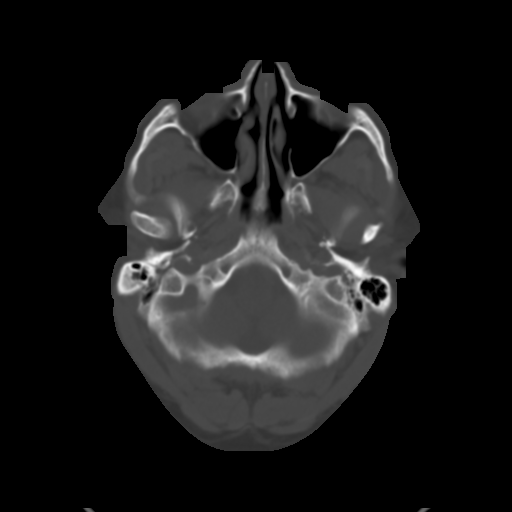
[im 9/34  brain]
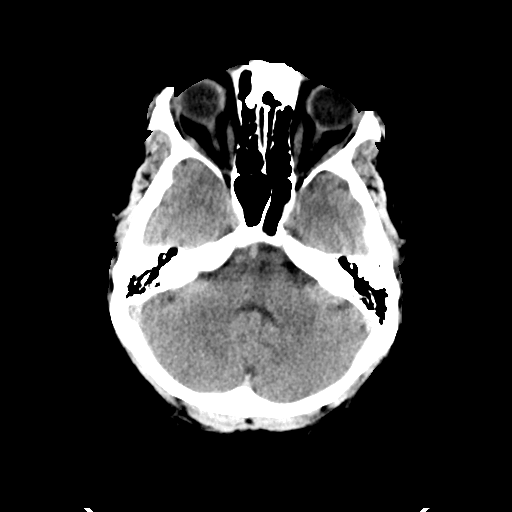
[im 13/34  brain]
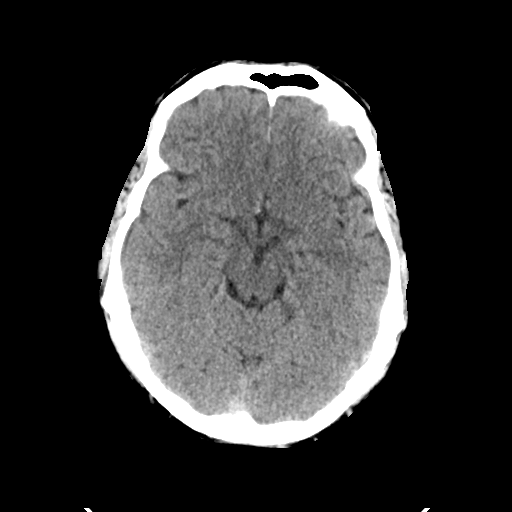
[im 17/34  brain]
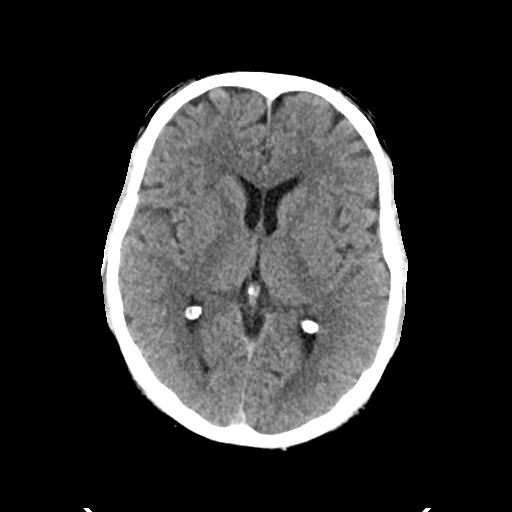
[im 21/34  brain]
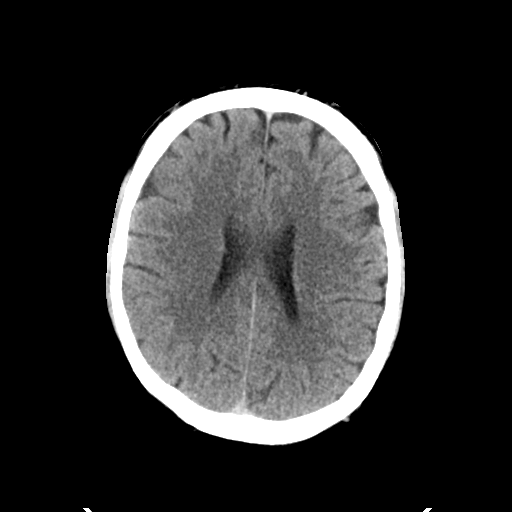
[im 21/34  bone]
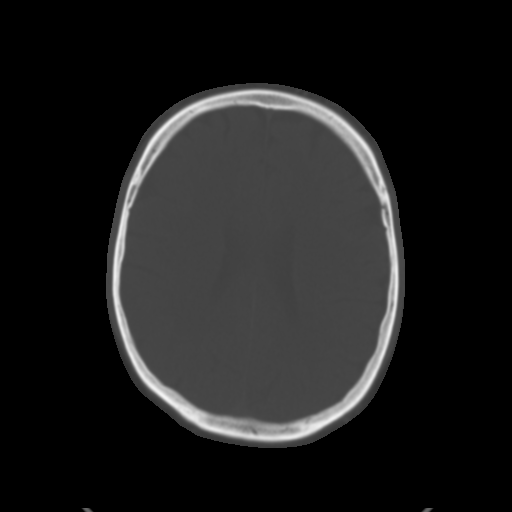
[im 25/34  brain]
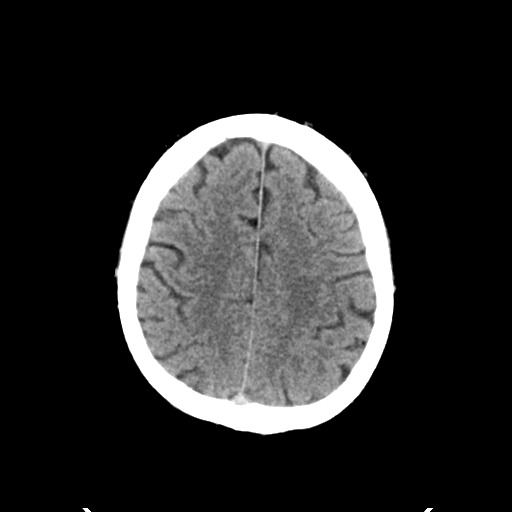
[im 29/34  brain]
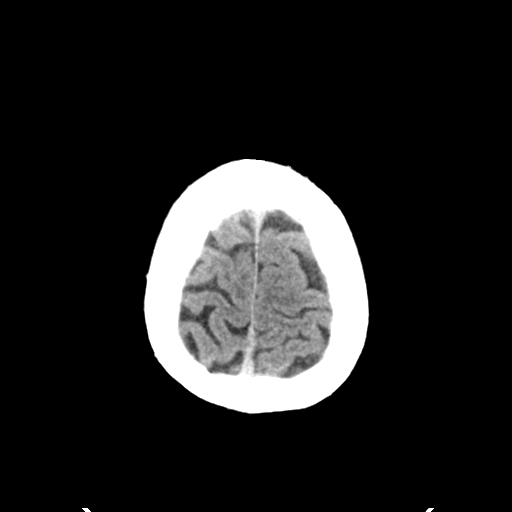

[Series 4: head bone · axial · 0.49mm/px · z∈[-86,-70]mm · 2 of 84 slices shown]
[im 9/84  bone]
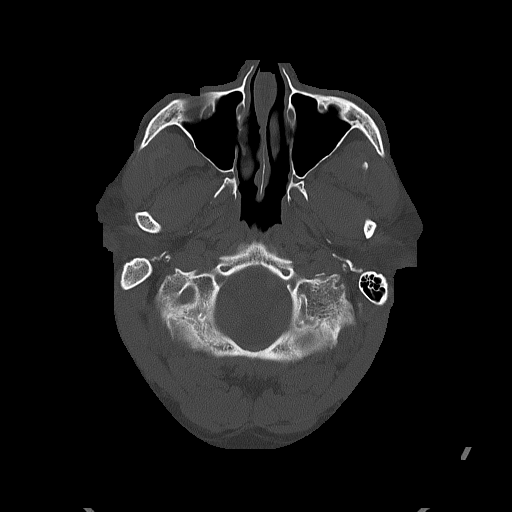
[im 17/84  bone]
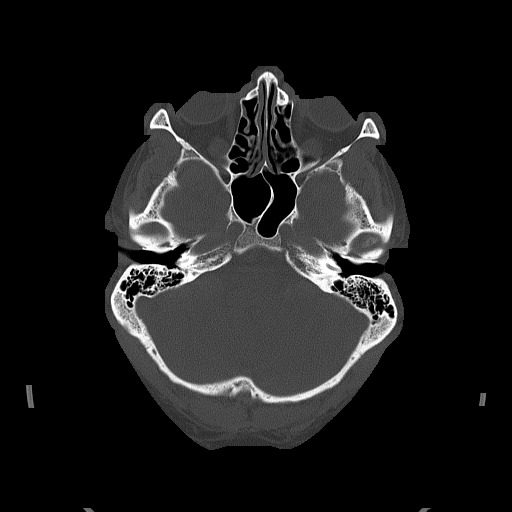

[Series 5: head without cor · coronal · non-contrast · 0.33mm/px · 3 of 74 slices shown]
[im 25/74  brain]
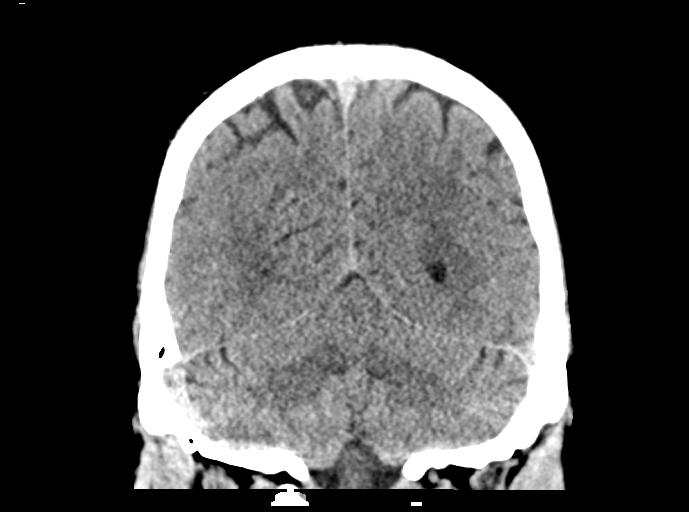
[im 33/74  brain]
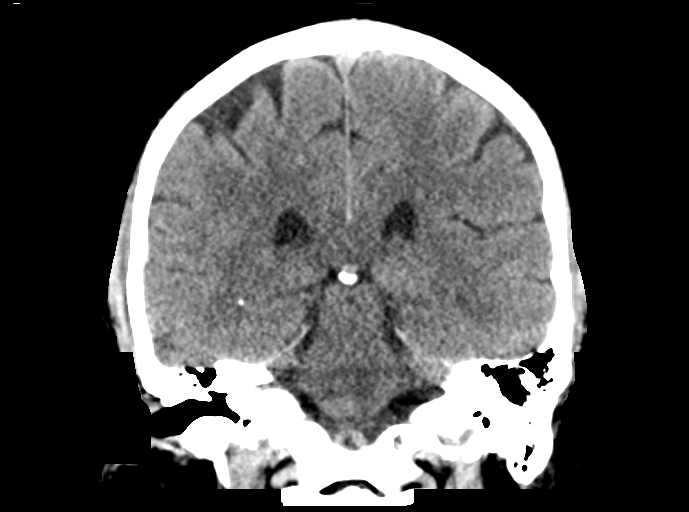
[im 41/74  brain]
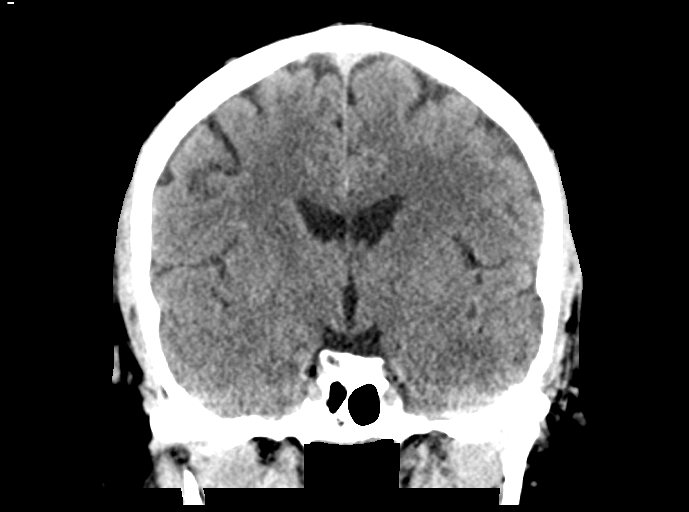

[Series 6: head without sag · sagittal · non-contrast · 0.33mm/px · 3 of 66 slices shown]
[im 22/66  brain]
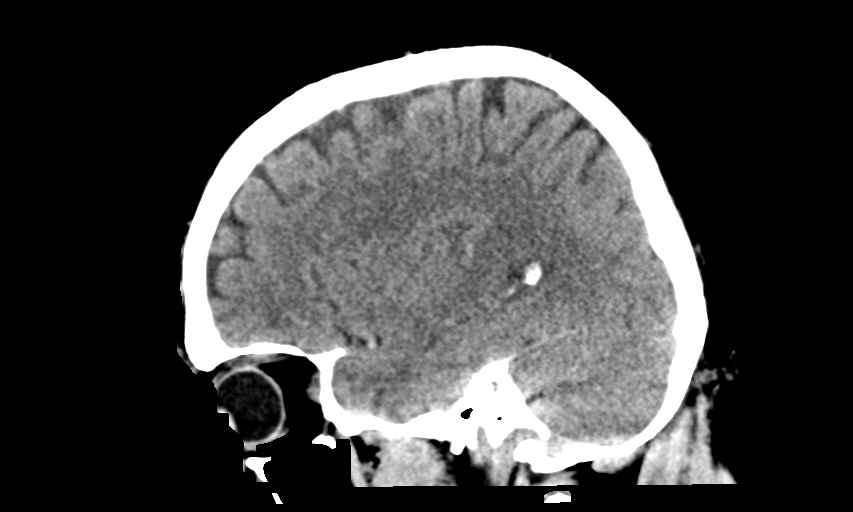
[im 33/66  brain]
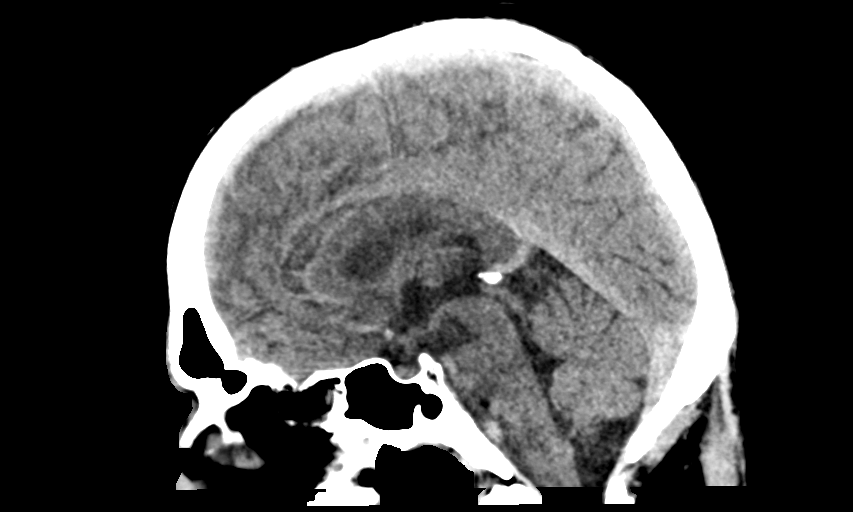
[im 44/66  brain]
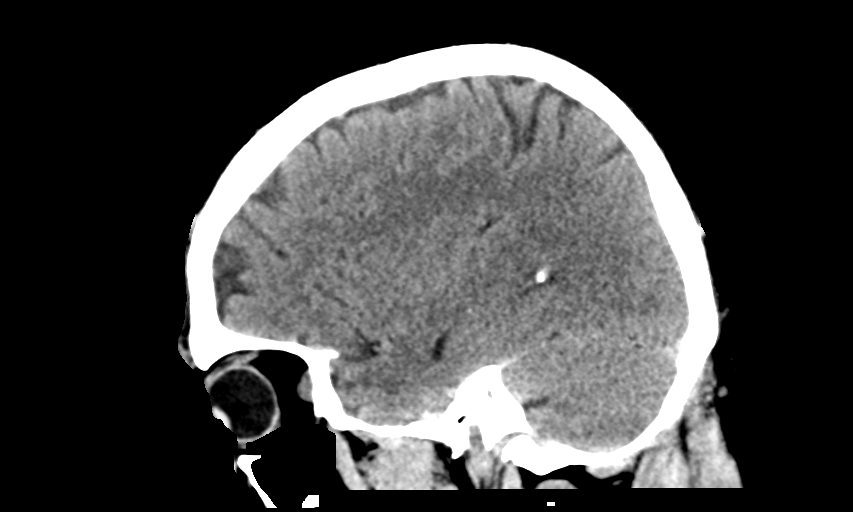

[15 of 47 positions shown; findings below may reference images not displayed]

FINDINGS: Brain: There is no mass, hemorrhage or extra-axial collection. The
size and configuration of the ventricles and extra-axial CSF spaces
are normal. The brain parenchyma is normal, without acute or chronic
infarction.

Vascular: No abnormal hyperdensity of the major intracranial
arteries or dural venous sinuses. No intracranial atherosclerosis.

Skull: The visualized skull base, calvarium and extracranial soft
tissues are normal.

Sinuses/Orbits: No fluid levels or advanced mucosal thickening of
the visualized paranasal sinuses. No mastoid or middle ear effusion.
The orbits are normal.
IMPRESSION: Normal head CT.

## 2021-09-06 ENCOUNTER — Other Ambulatory Visit: Payer: Self-pay

## 2021-09-06 ENCOUNTER — Ambulatory Visit: Payer: BLUE CROSS/BLUE SHIELD | Admitting: Cardiology

## 2021-09-06 ENCOUNTER — Encounter: Payer: Self-pay | Admitting: Cardiology

## 2021-09-06 VITALS — BP 138/70 | HR 84 | Ht 68.0 in | Wt 250.8 lb

## 2021-09-06 DIAGNOSIS — I2510 Atherosclerotic heart disease of native coronary artery without angina pectoris: Secondary | ICD-10-CM | POA: Diagnosis not present

## 2021-09-06 DIAGNOSIS — Z794 Long term (current) use of insulin: Secondary | ICD-10-CM

## 2021-09-06 DIAGNOSIS — E118 Type 2 diabetes mellitus with unspecified complications: Secondary | ICD-10-CM | POA: Diagnosis not present

## 2021-09-06 DIAGNOSIS — G4733 Obstructive sleep apnea (adult) (pediatric): Secondary | ICD-10-CM

## 2021-09-06 DIAGNOSIS — Z79899 Other long term (current) drug therapy: Secondary | ICD-10-CM

## 2021-09-06 DIAGNOSIS — I10 Essential (primary) hypertension: Secondary | ICD-10-CM | POA: Diagnosis not present

## 2021-09-06 DIAGNOSIS — G459 Transient cerebral ischemic attack, unspecified: Secondary | ICD-10-CM

## 2021-09-06 MED ORDER — FUROSEMIDE 20 MG PO TABS: 20.0000 mg | ORAL_TABLET | Freq: Every day | ORAL | 3 refills | Status: DC

## 2021-09-06 MED ORDER — FUROSEMIDE 20 MG PO TABS: 20.0000 mg | ORAL_TABLET | Freq: Every day | ORAL | 0 refills | Status: AC

## 2021-09-06 MED ORDER — POTASSIUM CHLORIDE ER 10 MEQ PO TBCR: 10.0000 meq | EXTENDED_RELEASE_TABLET | Freq: Every day | ORAL | 3 refills | Status: DC

## 2021-09-06 MED ORDER — POTASSIUM CHLORIDE ER 10 MEQ PO TBCR: 10.0000 meq | EXTENDED_RELEASE_TABLET | Freq: Every day | ORAL | 0 refills | Status: AC

## 2021-09-06 NOTE — Patient Instructions (Addendum)
Medication Instructions:  Your physician has recommended you make the following change in your medication:  START: Lasix 20 mg once daily for 7 days START: Potassium 10 mEq once daily for 7 days *If you need a refill on your cardiac medications before your next appointment, please call your pharmacy*   Lab Work: Your physician recommends that you return for lab work in:  TODAY: BMET, [place] If you have labs (blood work) drawn today and your tests are completely normal, you will receive your results only by: [place] (if you have [place]) OR A paper copy in the mail If you have any lab test that is abnormal or we need to change your treatment, we will call you to review the results.   Testing/Procedures: None   Follow-Up: At [facility], you and your health needs are our priority.  As part of our continuing mission to provide you with exceptional heart care, we have created designated Provider Care Teams.  These Care Teams include your primary Cardiologist (physician) and Advanced Practice Providers (APPs -  Physician Assistants and Nurse Practitioners) who all work together to provide you with the care you need, when you need it.  We recommend signing up for the patient portal called "MyChart".  Sign up information is provided on this After Visit Summary.  MyChart is used to connect with patients for Virtual Visits (Telemedicine).  Patients are able to view lab/test results, encounter notes, upcoming appointments, etc.  Non-urgent messages can be sent to your provider as well.   To learn more about what you can do with MyChart, go to [email or web address].    Your next appointment:   6 month(s)  The format for your next appointment:   In Person  Provider:   Berniece Salines, DO [address], [place], [place] [address]    Other Instructions
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
## 2021-09-07 LAB — BASIC METABOLIC PANEL
BUN/Creatinine Ratio: 15 (ref 9–20)
BUN: 13 mg/dL (ref 6–24)
CO2: 21 mmol/L (ref 20–29)
Calcium: 10 mg/dL (ref 8.7–10.2)
Chloride: 95 mmol/L — ABNORMAL LOW (ref 96–106)
Creatinine, Ser: 0.89 mg/dL (ref 0.76–1.27)
Glucose: 302 mg/dL — ABNORMAL HIGH (ref 65–99)
Potassium: 4 mmol/L (ref 3.5–5.2)
Sodium: 136 mmol/L (ref 134–144)
eGFR: 103 mL/min/{1.73_m2} (ref >59)

## 2021-09-07 LAB — MAGNESIUM: Magnesium: 2 mg/dL (ref 1.6–2.3)

## 2021-11-01 ENCOUNTER — Other Ambulatory Visit: Payer: Self-pay

## 2021-11-01 MED ORDER — METOPROLOL SUCCINATE ER 25 MG PO TB24
25.0000 mg | ORAL_TABLET | Freq: Every day | ORAL | 1 refills | Status: DC
Start: 2021-11-01 — End: 2022-03-12

## 2021-11-01 NOTE — Telephone Encounter (Signed)
  *  STAT* If patient is at the pharmacy, call can be transferred to refill team.   1. Which medications need to be refilled? (please list name of each medication and dose if known) metoprolol succinate (TOPROL-XL) 25 MG 24 hr tablet  2. Which pharmacy/location (including street and city if local pharmacy) is medication to be sent to? [place], [place]  3. Do they need a 30 day or 90 day supply? 90 days   Pt been out of meds for 3 days needs refill today

## 2022-01-08 DIAGNOSIS — K219 Gastro-esophageal reflux disease without esophagitis: Secondary | ICD-10-CM | POA: Diagnosis not present

## 2022-01-08 DIAGNOSIS — I10 Essential (primary) hypertension: Secondary | ICD-10-CM | POA: Diagnosis not present

## 2022-01-08 DIAGNOSIS — E119 Type 2 diabetes mellitus without complications: Secondary | ICD-10-CM | POA: Diagnosis not present

## 2022-01-08 DIAGNOSIS — I252 Old myocardial infarction: Secondary | ICD-10-CM | POA: Diagnosis not present

## 2022-02-26 DIAGNOSIS — M25512 Pain in left shoulder: Secondary | ICD-10-CM | POA: Diagnosis not present

## 2022-02-26 DIAGNOSIS — M25712 Osteophyte, left shoulder: Secondary | ICD-10-CM | POA: Diagnosis not present

## 2022-03-07 DIAGNOSIS — M25512 Pain in left shoulder: Secondary | ICD-10-CM | POA: Diagnosis not present

## 2022-03-07 DIAGNOSIS — M7542 Impingement syndrome of left shoulder: Secondary | ICD-10-CM | POA: Diagnosis not present

## 2022-03-12 ENCOUNTER — Other Ambulatory Visit: Payer: Self-pay | Admitting: Cardiology

## 2022-06-28 DIAGNOSIS — E119 Type 2 diabetes mellitus without complications: Secondary | ICD-10-CM | POA: Diagnosis not present

## 2022-06-28 DIAGNOSIS — Z981 Arthrodesis status: Secondary | ICD-10-CM | POA: Diagnosis not present

## 2022-06-28 DIAGNOSIS — M4316 Spondylolisthesis, lumbar region: Secondary | ICD-10-CM | POA: Diagnosis not present

## 2022-06-28 DIAGNOSIS — M5416 Radiculopathy, lumbar region: Secondary | ICD-10-CM | POA: Diagnosis not present

## 2022-06-28 DIAGNOSIS — R200 Anesthesia of skin: Secondary | ICD-10-CM | POA: Diagnosis not present

## 2022-06-28 DIAGNOSIS — M5136 Other intervertebral disc degeneration, lumbar region: Secondary | ICD-10-CM | POA: Diagnosis not present

## 2022-06-28 DIAGNOSIS — M545 Low back pain, unspecified: Secondary | ICD-10-CM | POA: Diagnosis not present

## 2022-06-28 DIAGNOSIS — R29890 Loss of height: Secondary | ICD-10-CM | POA: Diagnosis not present

## 2022-07-16 DIAGNOSIS — Z981 Arthrodesis status: Secondary | ICD-10-CM | POA: Diagnosis not present

## 2022-07-16 DIAGNOSIS — R202 Paresthesia of skin: Secondary | ICD-10-CM | POA: Diagnosis not present

## 2022-07-16 DIAGNOSIS — M5416 Radiculopathy, lumbar region: Secondary | ICD-10-CM | POA: Diagnosis not present

## 2022-07-16 DIAGNOSIS — M79605 Pain in left leg: Secondary | ICD-10-CM | POA: Diagnosis not present

## 2022-07-16 DIAGNOSIS — R200 Anesthesia of skin: Secondary | ICD-10-CM | POA: Diagnosis not present

## 2022-07-16 DIAGNOSIS — M79604 Pain in right leg: Secondary | ICD-10-CM | POA: Diagnosis not present

## 2022-07-19 DIAGNOSIS — R531 Weakness: Secondary | ICD-10-CM | POA: Diagnosis not present

## 2022-07-19 DIAGNOSIS — Z981 Arthrodesis status: Secondary | ICD-10-CM | POA: Diagnosis not present

## 2022-07-19 DIAGNOSIS — M5416 Radiculopathy, lumbar region: Secondary | ICD-10-CM | POA: Diagnosis not present

## 2022-07-19 DIAGNOSIS — R200 Anesthesia of skin: Secondary | ICD-10-CM | POA: Diagnosis not present

## 2022-07-25 ENCOUNTER — Other Ambulatory Visit: Payer: Self-pay

## 2022-07-25 MED ORDER — METOPROLOL SUCCINATE ER 25 MG PO TB24: 25.0000 mg | ORAL_TABLET | Freq: Every day | ORAL | 0 refills | Status: DC

## 2022-08-16 DIAGNOSIS — Z981 Arthrodesis status: Secondary | ICD-10-CM | POA: Diagnosis not present

## 2022-08-16 DIAGNOSIS — M5416 Radiculopathy, lumbar region: Secondary | ICD-10-CM | POA: Diagnosis not present

## 2022-10-02 DIAGNOSIS — M4726 Other spondylosis with radiculopathy, lumbar region: Secondary | ICD-10-CM | POA: Diagnosis not present

## 2022-10-02 DIAGNOSIS — Z981 Arthrodesis status: Secondary | ICD-10-CM | POA: Diagnosis not present

## 2022-10-02 DIAGNOSIS — M4727 Other spondylosis with radiculopathy, lumbosacral region: Secondary | ICD-10-CM | POA: Diagnosis not present

## 2022-10-02 DIAGNOSIS — M5116 Intervertebral disc disorders with radiculopathy, lumbar region: Secondary | ICD-10-CM | POA: Diagnosis not present

## 2022-10-02 DIAGNOSIS — M5117 Intervertebral disc disorders with radiculopathy, lumbosacral region: Secondary | ICD-10-CM | POA: Diagnosis not present

## 2022-10-03 DIAGNOSIS — E119 Type 2 diabetes mellitus without complications: Secondary | ICD-10-CM | POA: Diagnosis not present

## 2022-10-03 DIAGNOSIS — I10 Essential (primary) hypertension: Secondary | ICD-10-CM | POA: Diagnosis not present

## 2022-10-03 DIAGNOSIS — I252 Old myocardial infarction: Secondary | ICD-10-CM | POA: Diagnosis not present

## 2022-10-03 DIAGNOSIS — K219 Gastro-esophageal reflux disease without esophagitis: Secondary | ICD-10-CM | POA: Diagnosis not present

## 2022-10-04 DIAGNOSIS — Z981 Arthrodesis status: Secondary | ICD-10-CM | POA: Diagnosis not present

## 2022-10-04 DIAGNOSIS — R200 Anesthesia of skin: Secondary | ICD-10-CM | POA: Diagnosis not present

## 2022-10-04 DIAGNOSIS — R202 Paresthesia of skin: Secondary | ICD-10-CM | POA: Diagnosis not present

## 2022-10-04 DIAGNOSIS — M5126 Other intervertebral disc displacement, lumbar region: Secondary | ICD-10-CM | POA: Diagnosis not present

## 2022-10-04 DIAGNOSIS — M5136 Other intervertebral disc degeneration, lumbar region: Secondary | ICD-10-CM | POA: Diagnosis not present

## 2022-10-31 DIAGNOSIS — R079 Chest pain, unspecified: Secondary | ICD-10-CM | POA: Diagnosis not present

## 2022-10-31 DIAGNOSIS — R739 Hyperglycemia, unspecified: Secondary | ICD-10-CM | POA: Diagnosis not present

## 2022-10-31 DIAGNOSIS — R42 Dizziness and giddiness: Secondary | ICD-10-CM | POA: Diagnosis not present

## 2022-10-31 DIAGNOSIS — I639 Cerebral infarction, unspecified: Secondary | ICD-10-CM | POA: Diagnosis not present

## 2022-10-31 DIAGNOSIS — R9431 Abnormal electrocardiogram [ECG] [EKG]: Secondary | ICD-10-CM | POA: Diagnosis not present

## 2022-10-31 DIAGNOSIS — R519 Headache, unspecified: Secondary | ICD-10-CM | POA: Diagnosis not present

## 2022-12-19 DIAGNOSIS — Z743 Need for continuous supervision: Secondary | ICD-10-CM | POA: Diagnosis not present

## 2022-12-19 DIAGNOSIS — Z981 Arthrodesis status: Secondary | ICD-10-CM | POA: Diagnosis not present

## 2022-12-19 DIAGNOSIS — M545 Low back pain, unspecified: Secondary | ICD-10-CM | POA: Diagnosis not present

## 2022-12-19 DIAGNOSIS — R079 Chest pain, unspecified: Secondary | ICD-10-CM | POA: Diagnosis not present

## 2022-12-19 DIAGNOSIS — G4489 Other headache syndrome: Secondary | ICD-10-CM | POA: Diagnosis not present

## 2022-12-19 DIAGNOSIS — M47816 Spondylosis without myelopathy or radiculopathy, lumbar region: Secondary | ICD-10-CM | POA: Diagnosis not present

## 2022-12-19 DIAGNOSIS — R0789 Other chest pain: Secondary | ICD-10-CM | POA: Diagnosis not present

## 2022-12-19 DIAGNOSIS — R0981 Nasal congestion: Secondary | ICD-10-CM | POA: Diagnosis not present

## 2022-12-19 DIAGNOSIS — R051 Acute cough: Secondary | ICD-10-CM | POA: Diagnosis not present

## 2022-12-19 DIAGNOSIS — J111 Influenza due to unidentified influenza virus with other respiratory manifestations: Secondary | ICD-10-CM | POA: Diagnosis not present

## 2022-12-19 DIAGNOSIS — R0602 Shortness of breath: Secondary | ICD-10-CM | POA: Diagnosis not present

## 2022-12-19 DIAGNOSIS — I10 Essential (primary) hypertension: Secondary | ICD-10-CM | POA: Diagnosis not present

## 2022-12-19 DIAGNOSIS — M4726 Other spondylosis with radiculopathy, lumbar region: Secondary | ICD-10-CM | POA: Diagnosis not present

## 2022-12-26 ENCOUNTER — Other Ambulatory Visit: Payer: Self-pay

## 2022-12-26 MED ORDER — METOPROLOL SUCCINATE ER 25 MG PO TB24: 25.0000 mg | ORAL_TABLET | Freq: Every day | ORAL | 0 refills | Status: DC

## 2023-01-14 DIAGNOSIS — K5900 Constipation, unspecified: Secondary | ICD-10-CM | POA: Diagnosis not present

## 2023-01-14 DIAGNOSIS — R1310 Dysphagia, unspecified: Secondary | ICD-10-CM | POA: Diagnosis not present

## 2023-01-14 DIAGNOSIS — K219 Gastro-esophageal reflux disease without esophagitis: Secondary | ICD-10-CM | POA: Diagnosis not present

## 2023-01-14 DIAGNOSIS — R194 Change in bowel habit: Secondary | ICD-10-CM | POA: Diagnosis not present

## 2023-01-14 DIAGNOSIS — K625 Hemorrhage of anus and rectum: Secondary | ICD-10-CM | POA: Diagnosis not present

## 2023-01-23 DIAGNOSIS — R1310 Dysphagia, unspecified: Secondary | ICD-10-CM | POA: Diagnosis not present

## 2023-01-23 DIAGNOSIS — K449 Diaphragmatic hernia without obstruction or gangrene: Secondary | ICD-10-CM | POA: Diagnosis not present

## 2023-01-24 DIAGNOSIS — M79632 Pain in left forearm: Secondary | ICD-10-CM | POA: Diagnosis not present

## 2023-01-24 DIAGNOSIS — S51812A Laceration without foreign body of left forearm, initial encounter: Secondary | ICD-10-CM | POA: Diagnosis not present

## 2023-01-24 DIAGNOSIS — S61412A Laceration without foreign body of left hand, initial encounter: Secondary | ICD-10-CM | POA: Diagnosis not present

## 2023-01-24 DIAGNOSIS — M79642 Pain in left hand: Secondary | ICD-10-CM | POA: Diagnosis not present

## 2023-02-14 DIAGNOSIS — K449 Diaphragmatic hernia without obstruction or gangrene: Secondary | ICD-10-CM | POA: Diagnosis not present

## 2023-02-14 DIAGNOSIS — K219 Gastro-esophageal reflux disease without esophagitis: Secondary | ICD-10-CM | POA: Diagnosis not present

## 2023-02-14 DIAGNOSIS — Z800 Family history of malignant neoplasm of digestive organs: Secondary | ICD-10-CM | POA: Diagnosis not present

## 2023-02-14 DIAGNOSIS — Z86010 Personal history of colonic polyps: Secondary | ICD-10-CM | POA: Diagnosis not present

## 2023-02-14 DIAGNOSIS — R1310 Dysphagia, unspecified: Secondary | ICD-10-CM | POA: Diagnosis not present

## 2023-02-14 DIAGNOSIS — Z1211 Encounter for screening for malignant neoplasm of colon: Secondary | ICD-10-CM | POA: Diagnosis not present

## 2023-02-14 DIAGNOSIS — E119 Type 2 diabetes mellitus without complications: Secondary | ICD-10-CM | POA: Diagnosis not present

## 2023-02-14 DIAGNOSIS — K644 Residual hemorrhoidal skin tags: Secondary | ICD-10-CM | POA: Diagnosis not present

## 2023-02-14 DIAGNOSIS — K921 Melena: Secondary | ICD-10-CM | POA: Diagnosis not present

## 2023-02-14 DIAGNOSIS — K222 Esophageal obstruction: Secondary | ICD-10-CM | POA: Diagnosis not present

## 2023-02-26 ENCOUNTER — Other Ambulatory Visit: Payer: Self-pay | Admitting: Cardiology

## 2023-03-18 ENCOUNTER — Other Ambulatory Visit: Payer: Self-pay | Admitting: Cardiology

## 2023-04-02 ENCOUNTER — Telehealth: Payer: Self-pay | Admitting: Cardiology

## 2023-04-02 NOTE — Telephone Encounter (Signed)
*  STAT* If patient is at the pharmacy, call can be transferred to refill team.   1. Which medications need to be refilled? (please list name of each medication and dose if known) metoprolol succinate (TOPROL-XL) 25 MG 24 hr tablet   2. Which pharmacy/location (including street and city if local pharmacy) is medication to be sent to? [address] [place], [place] [address]  3. Do they need a 30 day or 90 day supply? 90 Day Supply  Pt only has one tablet left

## 2023-04-04 NOTE — Telephone Encounter (Signed)
Left message to call back. Appt scheduled Tobb 5-29 at 1120am. REFILL SENT. PT MUST KEEP APPOINTMENT FOR FUTURE REFILLS

## 2023-04-09 ENCOUNTER — Encounter: Payer: Self-pay | Admitting: Cardiology

## 2023-04-09 ENCOUNTER — Ambulatory Visit: Payer: 59 | Attending: Cardiology | Admitting: Cardiology

## 2023-04-09 VITALS — BP 126/74 | HR 62 | Ht 68.0 in | Wt 239.8 lb

## 2023-04-09 DIAGNOSIS — Z01812 Encounter for preprocedural laboratory examination: Secondary | ICD-10-CM | POA: Diagnosis not present

## 2023-04-09 DIAGNOSIS — I10 Essential (primary) hypertension: Secondary | ICD-10-CM

## 2023-04-09 DIAGNOSIS — R0609 Other forms of dyspnea: Secondary | ICD-10-CM

## 2023-04-09 DIAGNOSIS — I2510 Atherosclerotic heart disease of native coronary artery without angina pectoris: Secondary | ICD-10-CM | POA: Diagnosis not present

## 2023-04-09 DIAGNOSIS — G459 Transient cerebral ischemic attack, unspecified: Secondary | ICD-10-CM | POA: Diagnosis not present

## 2023-04-09 DIAGNOSIS — E119 Type 2 diabetes mellitus without complications: Secondary | ICD-10-CM

## 2023-04-09 DIAGNOSIS — G4733 Obstructive sleep apnea (adult) (pediatric): Secondary | ICD-10-CM

## 2023-04-09 MED ORDER — METOPROLOL SUCCINATE ER 25 MG PO TB24: ORAL_TABLET | ORAL | 3 refills | Status: AC

## 2023-04-09 MED ORDER — METOPROLOL TARTRATE 50 MG PO TABS: ORAL_TABLET | ORAL | 0 refills | Status: DC

## 2023-04-09 NOTE — Patient Instructions (Addendum)
Medication Instructions:  Your physician has recommended you make the following change in your medication:  START: Toprol- XL (metoprolol succinate) 12.5 mg (half a tablet) in the morning, 25 mg (one tablet) in at night.  *If you need a refill on your cardiac medications before your next appointment, please call your pharmacy*   Lab Work: None    Testing/Procedures:   Your cardiac CT will be scheduled at one of the below locations:   [facility] [address] [place], [place] [address] [phone]   If scheduled at [facility], please arrive at the [facility] and Children's Entrance (Entrance C2) of [facility] 30 minutes prior to test start time. You can use the FREE valet parking offered at entrance C (encouraged to control the heart rate for the test)  Proceed to the [facility] Radiology Department (first floor) to check-in and test prep.  All radiology patients and guests should use entrance C2 at [facility], accessed from [facility], even though the hospital's physical address listed is [address]     Please follow these instructions carefully (unless otherwise directed):  Hold all erectile dysfunction medications at least 3 days (72 hrs) prior to test. (Ie viagra, cialis, sildenafil, tadalafil, etc) We will administer nitroglycerin during this exam.   On the Night Before the Test: Be sure to Drink plenty of water. Do not consume any caffeinated/decaffeinated beverages or chocolate 12 hours prior to your test. Do not take any antihistamines 12 hours prior to your test.  On the Day of the Test: Drink plenty of water until 1 hour prior to the test. Do not eat any food 1 hour prior to test. You may take your regular medications prior to the test.  Take metoprolol (Lopressor) two hours prior to test. If you take Furosemide/Hydrochlorothiazide/Spironolactone, please HOLD on the morning of the test.      After the Test: Drink plenty of water. After receiving IV contrast, you may experience a mild flushed feeling. This is normal. On occasion, you may experience a mild rash up to 24 hours after the test. This is not dangerous. If this occurs, you can take Benadryl 25 mg and increase your fluid intake. If you experience trouble breathing, this can be serious. If it is severe call 911 IMMEDIATELY. If it is mild, please call our office. If you take any of these medications: Glipizide/Metformin, Avandament, Glucavance, please do not take 48 hours after completing test unless otherwise instructed.  We will call to schedule your test 2-4 weeks out understanding that some insurance companies will need an authorization prior to the service being performed.   For non-scheduling related questions, please contact the cardiac imaging nurse navigator should you have any questions/concerns: [place]well [place], Cardiac Imaging Nurse Navigator Larey Brick, Cardiac Imaging Nurse Navigator [facility] Direct Office Dial: [phone]   For scheduling needs, including cancellations and rescheduling, please call [place], [phone].    Follow-Up: At [facility], you and your health needs are our priority.  As part of our continuing mission to provide you with exceptional heart care, we have created designated Provider Care Teams.  These Care Teams include your primary Cardiologist (physician) and Advanced Practice Providers (APPs -  Physician Assistants and Nurse Practitioners) who all work together to provide you with the care you need, when you need it.   Your next appointment:   4 month(s)  Provider:   Thomasene Ripple, DO

## 2023-04-10 ENCOUNTER — Other Ambulatory Visit (INDEPENDENT_AMBULATORY_CARE_PROVIDER_SITE_OTHER): Payer: 59

## 2023-04-10 ENCOUNTER — Telehealth: Payer: Self-pay

## 2023-04-10 DIAGNOSIS — I255 Ischemic cardiomyopathy: Secondary | ICD-10-CM

## 2023-04-10 DIAGNOSIS — I10 Essential (primary) hypertension: Secondary | ICD-10-CM | POA: Diagnosis not present

## 2023-04-10 DIAGNOSIS — I2510 Atherosclerotic heart disease of native coronary artery without angina pectoris: Secondary | ICD-10-CM
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
## 2023-04-10 NOTE — Telephone Encounter (Signed)
Called and talked to pt about his CT scan being canceled and PET scan ordered per Dr. Servando Salina. Went over the PET instructions. Instructions sent in MyChart. Pt verbalized understanding, no questions expressed at this time.

## 2023-04-17 ENCOUNTER — Ambulatory Visit (HOSPITAL_COMMUNITY): Payer: 59

## 2023-08-01 ENCOUNTER — Ambulatory Visit: Payer: BLUE CROSS/BLUE SHIELD | Admitting: Cardiology

## 2023-08-04 ENCOUNTER — Telehealth (HOSPITAL_COMMUNITY): Payer: Self-pay | Admitting: *Deleted

## 2023-08-04 NOTE — Telephone Encounter (Signed)
Reaching out to patient to offer assistance regarding upcoming cardiac imaging study; pt verbalizes understanding of appt date/time, parking situation and where to check in, pre-test NPO status and verified current allergies; name and call back number provided for further questions should they arise  Merle Prescott RN Navigator Cardiac Imaging [facility] [phone] office [phone] cell  Patient aware to avoid caffeine 12 hours prior to his cardiac PET scan. 

## 2023-08-06 ENCOUNTER — Ambulatory Visit (HOSPITAL_COMMUNITY)
Admission: RE | Admit: 2023-08-06 | Discharge: 2023-08-06 | Disposition: A | Discharge status: Home / Self Care | Payer: 59 | Source: Ambulatory Visit | Attending: Cardiology | Admitting: Cardiology

## 2023-08-06 DIAGNOSIS — R0609 Other forms of dyspnea: Secondary | ICD-10-CM | POA: Diagnosis not present

## 2023-08-06 DIAGNOSIS — I700 Atherosclerosis of aorta: Secondary | ICD-10-CM | POA: Diagnosis not present

## 2023-08-06 LAB — NM PET CT CARDIAC PERFUSION MULTI W/ABSOLUTE BLOODFLOW
LV dias vol: 112 mL (ref 62–150)
MBFR: 2.86
Nuc Rest EF: 61 %
Nuc Stress EF: 65 %
Peak HR: 88 {beats}/min
Rest HR: 68 {beats}/min
Rest MBF: 0.76 ml/g/min
Rest Nuclear Isotope Dose: 27.9 mCi
Rest perfusion cavity size (mL): 112 mL
ST Depression (mm): 0 mm
Stress MBF: 2.17 ml/g/min
Stress Nuclear Isotope Dose: 28.1 mCi
Stress perfusion cavity size (mL): 130 mL
TID: 1.04

## 2023-08-06 MED ORDER — RUBIDIUM RB82 GENERATOR (RUBYFILL)
27.9900 | PACK | Freq: Once | INTRAVENOUS | Status: AC
  Administered 2023-08-06: 28.19 via INTRAVENOUS

## 2023-08-06 MED ORDER — REGADENOSON 0.4 MG/5ML IV SOLN
0.4000 mg | Freq: Once | INTRAVENOUS | Status: AC
  Administered 2023-08-06: 0.4 mg via INTRAVENOUS

## 2023-08-06 MED ORDER — RUBIDIUM RB82 GENERATOR (RUBYFILL)
27.9900 | PACK | Freq: Once | INTRAVENOUS | Status: AC
  Administered 2023-08-06: 27.99 via INTRAVENOUS

## 2023-08-06 MED ORDER — REGADENOSON 0.4 MG/5ML IV SOLN
INTRAVENOUS | Status: AC
  Filled 2023-08-06: qty 5

## 2023-08-08 ENCOUNTER — Encounter: Payer: Self-pay | Admitting: Cardiology

## 2023-08-11 ENCOUNTER — Encounter: Payer: Self-pay | Admitting: Cardiology

## 2023-08-21 DIAGNOSIS — M5416 Radiculopathy, lumbar region: Secondary | ICD-10-CM | POA: Diagnosis not present

## 2023-08-21 DIAGNOSIS — M545 Low back pain, unspecified: Secondary | ICD-10-CM | POA: Diagnosis not present

## 2023-08-21 DIAGNOSIS — M79609 Pain in unspecified limb: Secondary | ICD-10-CM | POA: Diagnosis not present

## 2023-09-24 DIAGNOSIS — M48062 Spinal stenosis, lumbar region with neurogenic claudication: Secondary | ICD-10-CM | POA: Diagnosis not present

## 2023-09-24 DIAGNOSIS — M47816 Spondylosis without myelopathy or radiculopathy, lumbar region: Secondary | ICD-10-CM | POA: Diagnosis not present

## 2023-09-24 DIAGNOSIS — G959 Disease of spinal cord, unspecified: Secondary | ICD-10-CM | POA: Diagnosis not present

## 2023-09-30 ENCOUNTER — Other Ambulatory Visit: Payer: Self-pay | Admitting: Neurological Surgery

## 2023-09-30 DIAGNOSIS — G959 Disease of spinal cord, unspecified: Secondary | ICD-10-CM

## 2023-09-30 DIAGNOSIS — M48062 Spinal stenosis, lumbar region with neurogenic claudication: Secondary | ICD-10-CM

## 2023-10-06 ENCOUNTER — Encounter: Payer: Self-pay | Admitting: Neurological Surgery

## 2023-10-08 ENCOUNTER — Encounter: Payer: Self-pay | Admitting: Neurological Surgery

## 2023-10-09 ENCOUNTER — Encounter: Payer: Self-pay | Admitting: Neurological Surgery

## 2023-10-15 ENCOUNTER — Ambulatory Visit
Admission: RE | Admit: 2023-10-15 | Discharge: 2023-10-15 | Disposition: A | Discharge status: Home / Self Care | Payer: 59 | Source: Ambulatory Visit | Attending: Neurological Surgery | Admitting: Neurological Surgery

## 2023-10-15 DIAGNOSIS — M50222 Other cervical disc displacement at C5-C6 level: Secondary | ICD-10-CM | POA: Diagnosis not present

## 2023-10-15 DIAGNOSIS — M4316 Spondylolisthesis, lumbar region: Secondary | ICD-10-CM | POA: Diagnosis not present

## 2023-10-15 DIAGNOSIS — M50223 Other cervical disc displacement at C6-C7 level: Secondary | ICD-10-CM | POA: Diagnosis not present

## 2023-10-15 DIAGNOSIS — G959 Disease of spinal cord, unspecified: Secondary | ICD-10-CM

## 2023-10-15 DIAGNOSIS — M5021 Other cervical disc displacement,  high cervical region: Secondary | ICD-10-CM | POA: Diagnosis not present

## 2023-10-15 DIAGNOSIS — Z981 Arthrodesis status: Secondary | ICD-10-CM | POA: Diagnosis not present

## 2023-10-15 DIAGNOSIS — M48062 Spinal stenosis, lumbar region with neurogenic claudication: Secondary | ICD-10-CM

## 2023-10-15 DIAGNOSIS — M47816 Spondylosis without myelopathy or radiculopathy, lumbar region: Secondary | ICD-10-CM | POA: Diagnosis not present

## 2023-10-15 DIAGNOSIS — M50221 Other cervical disc displacement at C4-C5 level: Secondary | ICD-10-CM | POA: Diagnosis not present

## 2023-10-20 ENCOUNTER — Encounter (INDEPENDENT_AMBULATORY_CARE_PROVIDER_SITE_OTHER): Payer: Self-pay

## 2023-10-29 DIAGNOSIS — M47816 Spondylosis without myelopathy or radiculopathy, lumbar region: Secondary | ICD-10-CM | POA: Diagnosis not present

## 2023-10-29 DIAGNOSIS — Z6834 Body mass index (BMI) 34.0-34.9, adult: Secondary | ICD-10-CM | POA: Diagnosis not present

## 2023-10-29 DIAGNOSIS — M4722 Other spondylosis with radiculopathy, cervical region: Secondary | ICD-10-CM | POA: Diagnosis not present

## 2023-11-11 ENCOUNTER — Other Ambulatory Visit: Payer: Self-pay | Admitting: Neurological Surgery

## 2023-11-11 DIAGNOSIS — M4302 Spondylolysis, cervical region: Secondary | ICD-10-CM

## 2023-11-11 DIAGNOSIS — M47816 Spondylosis without myelopathy or radiculopathy, lumbar region: Secondary | ICD-10-CM

## 2023-12-05 NOTE — Discharge Instructions (Signed)

## 2023-12-08 ENCOUNTER — Inpatient Hospital Stay
Admission: RE | Admit: 2023-12-08 | Discharge: 2023-12-08 | Disposition: A | Discharge status: Home / Self Care | Payer: 59 | Source: Ambulatory Visit | Attending: Neurological Surgery | Admitting: Neurological Surgery

## 2023-12-22 NOTE — Discharge Instructions (Signed)

## 2023-12-23 ENCOUNTER — Ambulatory Visit
Admission: RE | Admit: 2023-12-23 | Discharge: 2023-12-23 | Disposition: A | Discharge status: Home / Self Care | Payer: 59 | Source: Ambulatory Visit | Attending: Neurological Surgery | Admitting: Neurological Surgery

## 2023-12-23 DIAGNOSIS — M4302 Spondylolysis, cervical region: Secondary | ICD-10-CM

## 2023-12-23 DIAGNOSIS — M4722 Other spondylosis with radiculopathy, cervical region: Secondary | ICD-10-CM | POA: Diagnosis not present

## 2023-12-23 MED ORDER — TRIAMCINOLONE ACETONIDE 40 MG/ML IJ SUSP (RADIOLOGY)
60.0000 mg | Freq: Once | INTRAMUSCULAR | Status: AC
  Administered 2023-12-23: 60 mg via EPIDURAL

## 2023-12-23 MED ORDER — IOPAMIDOL (ISOVUE-M 300) INJECTION 61%
1.0000 mL | Freq: Once | INTRAMUSCULAR | Status: AC | PRN
  Administered 2023-12-23: 1 mL via EPIDURAL

## 2024-01-05 DIAGNOSIS — M4722 Other spondylosis with radiculopathy, cervical region: Secondary | ICD-10-CM | POA: Diagnosis not present

## 2024-01-05 DIAGNOSIS — Z6834 Body mass index (BMI) 34.0-34.9, adult: Secondary | ICD-10-CM | POA: Diagnosis not present

## 2024-01-09 DIAGNOSIS — G4452 New daily persistent headache (NDPH): Secondary | ICD-10-CM | POA: Diagnosis not present

## 2024-01-21 DIAGNOSIS — M4722 Other spondylosis with radiculopathy, cervical region: Secondary | ICD-10-CM | POA: Diagnosis not present

## 2024-01-21 DIAGNOSIS — Z6834 Body mass index (BMI) 34.0-34.9, adult: Secondary | ICD-10-CM | POA: Diagnosis not present

## 2024-01-22 ENCOUNTER — Telehealth: Payer: Self-pay | Admitting: *Deleted

## 2024-01-22 NOTE — Telephone Encounter (Signed)
   Pre-operative Risk Assessment    Patient Name: Riley Grant.  DOB: 1971/07/10 MRN: [id number]   Date of last office visit: 04/09/2023 Date of next office visit: None   Request for Surgical Clearance    Procedure:   Cervical fusion  Date of Surgery:  Clearance TBD                                 Surgeon:  Dr. Kendell Bane Dawley Surgeon's Group or Practice Name:  [facility] Phone number:  [phone] Fax number:  [phone]   Type of Clearance Requested:   - Medical  - Pharmacy:  Hold Aspirin Not Indicated   Type of Anesthesia:  General    Additional requests/questions:    Signed, Emmit Pomfret   01/22/2024, 2:30 PM

## 2024-01-22 NOTE — Telephone Encounter (Signed)
Spoke with patient and scheduled him for a preop telehealth visit on 02/04/24. Will route back to requesting surgeons office to make them aware.

## 2024-01-22 NOTE — Telephone Encounter (Signed)
   Name: Riley Grant.  DOB: Mar 12, 1971  MRN: [id number]  Primary Cardiologist: Thomasene Ripple, DO   Preoperative team, please contact this patient and set up a phone call appointment for further preoperative risk assessment. Please obtain consent and complete medication review. Thank you for your help.  I confirm that guidance regarding antiplatelet and oral anticoagulation therapy has been completed and, if necessary, noted below.  Patient's aspirin is not prescribed by cardiology.  Recommendations for holding aspirin will need to come from prescribing provider.  I also confirmed the patient resides in the state of [place]. As per [facility] Medical Board telemedicine laws, the patient must reside in the state in which the provider is licensed.   Ronney Asters, NP 01/22/2024, 3:33 PM [facility]

## 2024-01-22 NOTE — Telephone Encounter (Signed)
  Patient Consent for Virtual Visit        Riley Grant. has provided verbal consent on 01/22/2024 for a virtual visit (video or telephone).   CONSENT FOR VIRTUAL VISIT FOR:  Riley Grant.  By participating in this virtual visit I agree to the following:  I hereby voluntarily request, consent and authorize [facility] and its employed or contracted physicians, physician assistants, nurse practitioners or other licensed health care professionals (the Practitioner), to provide me with telemedicine health care services (the "Services") as deemed necessary by the treating Practitioner. I acknowledge and consent to receive the Services by the Practitioner via telemedicine. I understand that the telemedicine visit will involve communicating with the Practitioner through live audiovisual communication technology and the disclosure of certain medical information by electronic transmission. I acknowledge that I have been given the opportunity to request an in-person assessment or other available alternative prior to the telemedicine visit and am voluntarily participating in the telemedicine visit.  I understand that I have the right to withhold or withdraw my consent to the use of telemedicine in the course of my care at any time, without affecting my right to future care or treatment, and that the Practitioner or I may terminate the telemedicine visit at any time. I understand that I have the right to inspect all information obtained and/or recorded in the course of the telemedicine visit and may receive copies of available information for a reasonable fee.  I understand that some of the potential risks of receiving the Services via telemedicine include:  Delay or interruption in medical evaluation due to technological equipment failure or disruption; Information transmitted may not be sufficient (e.g. poor resolution of images) to allow for appropriate medical decision making by the  Practitioner; and/or  In rare instances, security protocols could fail, causing a breach of personal health information.  Furthermore, I acknowledge that it is my responsibility to provide information about my medical history, conditions and care that is complete and accurate to the best of my ability. I acknowledge that Practitioner's advice, recommendations, and/or decision may be based on factors not within their control, such as incomplete or inaccurate data provided by me or distortions of diagnostic images or specimens that may result from electronic transmissions. I understand that the practice of medicine is not an exact science and that Practitioner makes no warranties or guarantees regarding treatment outcomes. I acknowledge that a copy of this consent can be made available to me via my patient portal [facility] MyChart), or I can request a printed copy by calling the office of [facility].    I understand that my insurance will be billed for this visit.   I have read or had this consent read to me. I understand the contents of this consent, which adequately explains the benefits and risks of the Services being provided via telemedicine.  I have been provided ample opportunity to ask questions regarding this consent and the Services and have had my questions answered to my satisfaction. I give my informed consent for the services to be provided through the use of telemedicine in my medical care

## 2024-01-29 DIAGNOSIS — R1032 Left lower quadrant pain: Secondary | ICD-10-CM | POA: Diagnosis not present

## 2024-01-29 DIAGNOSIS — R9431 Abnormal electrocardiogram [ECG] [EKG]: Secondary | ICD-10-CM | POA: Diagnosis not present

## 2024-01-29 DIAGNOSIS — I4519 Other right bundle-branch block: Secondary | ICD-10-CM | POA: Diagnosis not present

## 2024-02-04 ENCOUNTER — Ambulatory Visit: Payer: Self-pay | Attending: Physician Assistant

## 2024-02-04 DIAGNOSIS — Z01810 Encounter for preprocedural cardiovascular examination: Secondary | ICD-10-CM
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
## 2024-02-04 NOTE — Addendum Note (Signed)
Addended by: Sharlene Dory on: 02/04/2024 09:31 AM   Modules accepted: Level of Service

## 2024-05-11 DIAGNOSIS — Z043 Encounter for examination and observation following other accident: Secondary | ICD-10-CM | POA: Diagnosis not present

## 2024-05-11 DIAGNOSIS — W19XXXA Unspecified fall, initial encounter: Secondary | ICD-10-CM | POA: Diagnosis not present

## 2024-05-11 DIAGNOSIS — R42 Dizziness and giddiness: Secondary | ICD-10-CM | POA: Diagnosis not present

## 2024-05-11 DIAGNOSIS — R55 Syncope and collapse: Secondary | ICD-10-CM | POA: Diagnosis not present

## 2024-05-11 DIAGNOSIS — S0003XA Contusion of scalp, initial encounter: Secondary | ICD-10-CM | POA: Diagnosis not present

## 2024-05-14 DIAGNOSIS — R55 Syncope and collapse: Secondary | ICD-10-CM | POA: Diagnosis not present

## 2024-06-02 ENCOUNTER — Encounter: Payer: Self-pay | Admitting: Podiatry

## 2024-06-06 NOTE — Progress Notes (Signed)
 Patient did not show for his scheduled appointment on 06/02/2024

## 2024-10-13 ENCOUNTER — Encounter: Payer: Self-pay | Admitting: *Deleted

## 2024-10-14 ENCOUNTER — Encounter: Payer: Self-pay | Admitting: *Deleted

## 2024-10-15 ENCOUNTER — Ambulatory Visit: Attending: Cardiology

## 2024-10-15 ENCOUNTER — Encounter: Payer: Self-pay | Admitting: Cardiology

## 2024-10-15 ENCOUNTER — Ambulatory Visit: Payer: Self-pay | Attending: Cardiology | Admitting: Cardiology

## 2024-10-15 VITALS — BP 148/90 | HR 86 | Ht 68.0 in | Wt 242.0 lb

## 2024-10-15 DIAGNOSIS — G4733 Obstructive sleep apnea (adult) (pediatric): Secondary | ICD-10-CM

## 2024-10-15 DIAGNOSIS — Z794 Long term (current) use of insulin: Secondary | ICD-10-CM

## 2024-10-15 DIAGNOSIS — I2510 Atherosclerotic heart disease of native coronary artery without angina pectoris: Secondary | ICD-10-CM | POA: Diagnosis not present

## 2024-10-15 DIAGNOSIS — R0609 Other forms of dyspnea: Secondary | ICD-10-CM

## 2024-10-15 DIAGNOSIS — R002 Palpitations: Secondary | ICD-10-CM

## 2024-10-15 DIAGNOSIS — E119 Type 2 diabetes mellitus without complications: Secondary | ICD-10-CM | POA: Diagnosis not present

## 2024-10-15 MED ORDER — OLMESARTAN MEDOXOMIL 40 MG PO TABS: 40.0000 mg | ORAL_TABLET | Freq: Every day | ORAL | 3 refills | Status: AC

## 2024-10-15 NOTE — Patient Instructions (Signed)
 Medication Instructions:   STOP LOSARTAN   START OLMESARTAN 40 MG ONCE DAILY  *If you need a refill on your cardiac medications before your next appointment, please call your pharmacy*  Lab Work:  Your physician recommends that you HAVE LAB WORK TODAY  MyChart Message (if you have MyChart) OR A paper copy in the mail If you have any lab test that is abnormal or we need to change your treatment, we will call you to review the results.  Testing/Procedures:  Your physician has requested that you have an echocardiogram. Echocardiography is a painless test that uses sound waves to create images of your heart. It provides your doctor with information about the size and shape of your heart and how well your heart's chambers and valves are working. This procedure takes approximately one hour. There are no restrictions for this procedure. Please do NOT wear cologne, perfume, aftershave, or lotions (deodorant is allowed). Please arrive 15 minutes prior to your appointment time.  Please note: We ask at that you not bring children with you during ultrasound (echo/ vascular) testing. Due to room size and safety concerns, children are not allowed in the ultrasound rooms during exams. Our front office staff cannot provide observation of children in our lobby area while testing is being conducted. An adult accompanying a patient to their appointment will only be allowed in the ultrasound room at the discretion of the ultrasound technician under special circumstances. We apologize for any inconvenience. [address]   ZIO XT- Long Term Monitor Instructions  Your physician has requested you wear a ZIO patch monitor for 14 days.  This is a single patch monitor. [employer] supplies one patch monitor per enrollment. Additional stickers are not available. Please do not apply patch if you will be having a Nuclear Stress Test,  Echocardiogram, Cardiac CT, MRI, or Chest Xray during the period you would be  wearing the  monitor. The patch cannot be worn during these tests. You cannot remove and re-apply the  ZIO XT patch monitor.  Your ZIO patch monitor will be mailed 3 day USPS to your address on file. It may take 3-5 days  to receive your monitor after you have been enrolled.  Once you have received your monitor, please review the enclosed instructions. Your monitor  has already been registered assigning a specific monitor serial # to you.  Billing and Patient Assistance Program Information  We have supplied [employer] with any of your insurance information on file for billing purposes. [employer] offers a sliding scale Patient Assistance Program for patients that do not have  insurance, or whose insurance does not completely cover the cost of the ZIO monitor.  You must apply for the Patient Assistance Program to qualify for this discounted rate.  To apply, please call [employer] at [phone], select option 4, select option 2, ask to apply for  Patient Assistance Program. Meredeth will ask your household income, and how many people  are in your household. They will quote your out-of-pocket cost based on that information.  [employer] will also be able to set up a 27-month, interest-free payment plan if needed.  Applying the monitor   Shave hair from upper left chest.  Hold abrader disc by orange tab. Rub abrader in 40 strokes over the upper left chest as  indicated in your monitor instructions.  Clean area with 4 enclosed alcohol pads. Let dry.  Apply patch as indicated in monitor instructions. Patch will be placed under collarbone on left  side of chest  with arrow pointing upward.  Rub patch adhesive wings for 2 minutes. Remove white label marked 1. Remove the white  label marked 2. Rub patch adhesive wings for 2 additional minutes.  While looking in a mirror, press and release button in center of patch. A small green light will  flash 3-4 times. This will be your only indicator that the  monitor has been turned on.  Do not shower for the first 24 hours. You may shower after the first 24 hours.  Press the button if you feel a symptom. You will hear a small click. Record Date, Time and  Symptom in the Patient Logbook.  When you are ready to remove the patch, follow instructions on the last 2 pages of Patient  Logbook. Stick patch monitor onto the last page of Patient Logbook.  Place Patient Logbook in the blue and white box. Use locking tab on box and tape box closed  securely. The blue and white box has prepaid postage on it. Please place it in the mailbox as  soon as possible. Your physician should have your test results approximately 7 days after the  monitor has been mailed back to [facility].  Call [facility] Customer Care at [phone] if you have questions regarding  your ZIO XT patch monitor. Call them immediately if you see an orange light blinking on your  monitor.  If your monitor falls off in less than 4 days, contact our Monitor department at [phone].  If your monitor becomes loose or falls off after 4 days call [employer] at [phone] for  suggestions on securing your monitor   Follow-Up: At [facility], you and your health needs are our priority.  As part of our continuing mission to provide you with exceptional heart care, our providers are all part of one team.  This team includes your primary Cardiologist (physician) and Advanced Practice Providers or APPs (Physician Assistants and Nurse Practitioners) who all work together to provide you with the care you need, when you need it.  Your next appointment:   16 week(s)  Provider:   Kardie Tobb, DO

## 2024-10-15 NOTE — Progress Notes (Unsigned)
 Enrolled patient for a 14 day Zio XT  monitor to be mailed to patients home
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
## 2024-10-16 LAB — COMPREHENSIVE METABOLIC PANEL WITH GFR
ALT: 55 IU/L — ABNORMAL HIGH (ref 0–44)
AST: 35 IU/L (ref 0–40)
Albumin: 4.4 g/dL (ref 3.8–4.9)
Alkaline Phosphatase: 99 IU/L (ref 47–123)
BUN/Creatinine Ratio: 16 (ref 9–20)
BUN: 18 mg/dL (ref 6–24)
Bilirubin Total: 0.7 mg/dL (ref 0.0–1.2)
CO2: 23 mmol/L (ref 20–29)
Calcium: 9.7 mg/dL (ref 8.7–10.2)
Chloride: 96 mmol/L (ref 96–106)
Creatinine, Ser: 1.15 mg/dL (ref 0.76–1.27)
Globulin, Total: 2.8 g/dL (ref 1.5–4.5)
Glucose: 323 mg/dL — ABNORMAL HIGH (ref 70–99)
Potassium: 4.9 mmol/L (ref 3.5–5.2)
Sodium: 136 mmol/L (ref 134–144)
Total Protein: 7.2 g/dL (ref 6.0–8.5)
eGFR: 75 mL/min/1.73 (ref >59)

## 2024-10-16 LAB — T4, FREE: Free T4: 0.94 ng/dL (ref 0.82–1.77)

## 2024-10-16 LAB — MAGNESIUM: Magnesium: 2.2 mg/dL (ref 1.6–2.3)

## 2024-10-16 LAB — T3, FREE: T3, Free: 2.7 pg/mL (ref 2.0–4.4)

## 2024-10-16 LAB — TSH: TSH: 1.46 u[IU]/mL (ref 0.450–4.500)

## 2024-10-18 ENCOUNTER — Ambulatory Visit: Payer: Self-pay | Admitting: Cardiology

## 2024-11-01 NOTE — Telephone Encounter (Signed)
 Results/message from Dr. Emmette Harms has been released to MyChart. A letter is being sent to the last known home address.

## 2024-11-22 DIAGNOSIS — R002 Palpitations: Secondary | ICD-10-CM | POA: Diagnosis not present

## 2024-11-23 DIAGNOSIS — R002 Palpitations: Secondary | ICD-10-CM

## 2024-11-24 ENCOUNTER — Ambulatory Visit (HOSPITAL_COMMUNITY)
Admission: RE | Admit: 2024-11-24 | Discharge: 2024-11-24 | Disposition: A | Source: Ambulatory Visit | Attending: Cardiology | Admitting: Cardiology

## 2024-11-24 DIAGNOSIS — R0609 Other forms of dyspnea: Secondary | ICD-10-CM | POA: Insufficient documentation

## 2024-11-24 LAB — ECHOCARDIOGRAM COMPLETE
Area-P 1/2: 3.03 cm2
S' Lateral: 2.3 cm

## 2024-12-08 ENCOUNTER — Ambulatory Visit: Admitting: Podiatry

## 2024-12-08 DIAGNOSIS — M79675 Pain in left toe(s): Secondary | ICD-10-CM

## 2024-12-08 DIAGNOSIS — E1142 Type 2 diabetes mellitus with diabetic polyneuropathy: Secondary | ICD-10-CM

## 2024-12-08 DIAGNOSIS — B353 Tinea pedis: Secondary | ICD-10-CM

## 2024-12-08 DIAGNOSIS — M79674 Pain in right toe(s): Secondary | ICD-10-CM

## 2024-12-08 DIAGNOSIS — B351 Tinea unguium: Secondary | ICD-10-CM | POA: Diagnosis not present

## 2024-12-08 MED ORDER — KETOCONAZOLE 2 % EX CREA: 1.0000 | TOPICAL_CREAM | Freq: Every day | CUTANEOUS | 2 refills | Status: AC
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
## 2025-01-19 ENCOUNTER — Telehealth: Payer: Self-pay | Admitting: Cardiology

## 2025-01-19 NOTE — Telephone Encounter (Signed)
 Called pt advised of MD recommendation pt expresses understanding.

## 2025-01-19 NOTE — Telephone Encounter (Signed)
 Pt reports when wore recent monitor patch didn't stay on good so doesn't feel it captured events as it should have.    Reports dizziness with quick position changes and bending over.  Sometimes feels as if will pass out.   Recently had HA, nausea and a little shoulder pain with funny heat beat.  Reports episodes normally last 2-3 min.  Notices palpitations a lot at night when lying still. Reports drinks Gallon to 1.5 gal daily.  Takes metoprolol  as ordered.  Does not have a BP Cuff to check BP and HR.  Reports has 5-7 episodes in a day when up and moving around has to still down and relax to feel better.  Denies increased stress, caffeine use or alcohol use.  Does not use CPAP machine reports just can't tolerate it. Advised pt will send to MD to address.

## 2025-01-19 NOTE — Telephone Encounter (Signed)
 Patient c/o Palpitations: STAT if patient c/o lightheadedness, shortness of breath, or chest pain  How long have you had palpitations/irregular HR/ Afib? Are you having the symptoms now?  Feels flutters/numbness in chest. Has had about 6 episodes in the past few weeks. Occurs after minimal exertion. No symptoms currently.  Are you currently experiencing lightheadedness, SOB or CP?  No   Do you have a history of afib (atrial fibrillation) or irregular heart rhythm?   Have you checked your BP or HR? (document readings if available):  No   Are you experiencing any other symptoms?  Mild headache, mild stomach ache

## 2025-01-20 ENCOUNTER — Ambulatory Visit: Payer: Self-pay | Admitting: Cardiology
